# Patient Record
Sex: Female | Born: 1995 | Race: White | Hispanic: No | Marital: Married | Smoking: Never smoker
Health system: Southern US, Community
[De-identification: ages and names within clinical notes are randomized; demographics above are authoritative.]

## PROBLEM LIST (undated history)

## (undated) DIAGNOSIS — F419 Anxiety disorder, unspecified: Secondary | ICD-10-CM

## (undated) DIAGNOSIS — Z789 Other specified health status: Secondary | ICD-10-CM

## (undated) HISTORY — PX: NO PAST SURGERIES: SHX2092

## (undated) HISTORY — PX: WISDOM TOOTH EXTRACTION: SHX21

## (undated) HISTORY — DX: Anxiety disorder, unspecified: F41.9

---
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
## 2018-12-10 ENCOUNTER — Inpatient Hospital Stay (HOSPITAL_COMMUNITY)
Admission: AD | Admit: 2018-12-10 | Discharge: 2018-12-10 | Disposition: A | Discharge status: Home / Self Care | Payer: BC Managed Care – PPO | Attending: Obstetrics and Gynecology | Admitting: Obstetrics and Gynecology

## 2018-12-10 ENCOUNTER — Other Ambulatory Visit: Payer: Self-pay

## 2018-12-10 ENCOUNTER — Encounter (HOSPITAL_COMMUNITY): Payer: Self-pay | Admitting: *Deleted

## 2018-12-10 DIAGNOSIS — O210 Mild hyperemesis gravidarum: Secondary | ICD-10-CM | POA: Diagnosis not present

## 2018-12-10 DIAGNOSIS — Z3A08 8 weeks gestation of pregnancy: Secondary | ICD-10-CM | POA: Diagnosis not present

## 2018-12-10 HISTORY — DX: Other specified health status: Z78.9

## 2018-12-10 LAB — URINALYSIS, ROUTINE W REFLEX MICROSCOPIC
Bilirubin Urine: NEGATIVE
Glucose, UA: NEGATIVE mg/dL
Hgb urine dipstick: NEGATIVE
Ketones, ur: 15 mg/dL — AB
Nitrite: NEGATIVE
Protein, ur: NEGATIVE mg/dL
Specific Gravity, Urine: 1.025 (ref 1.005–1.030)
pH: 6 (ref 5.0–8.0)

## 2018-12-10 LAB — URINALYSIS, MICROSCOPIC (REFLEX)

## 2018-12-10 LAB — POCT PREGNANCY, URINE: Preg Test, Ur: POSITIVE — AB

## 2018-12-10 MED ORDER — ONDANSETRON 8 MG PO TBDP: 8.0000 mg | ORAL_TABLET | Freq: Three times a day (TID) | ORAL | 2 refills | Status: DC | PRN

## 2018-12-10 MED ORDER — ONDANSETRON 4 MG PO TBDP
8.0000 mg | ORAL_TABLET | Freq: Once | ORAL | Status: AC
  Administered 2018-12-10: 8 mg via ORAL
  Filled 2018-12-10: qty 2

## 2018-12-10 MED ORDER — DOCUSATE SODIUM 100 MG PO CAPS: 100.0000 mg | ORAL_CAPSULE | Freq: Three times a day (TID) | ORAL | 0 refills | Status: DC

## 2018-12-10 NOTE — MAU Provider Note (Addendum)
Patient Natalie Davenport is a 23 y.o. G1P0 At [redacted]w[redacted]d here with complaints of nausea and vomiting since this morning at 4 am. She found out she was pregnant around the 4th of July. Since July 4th, she has had some nausea but it got much worse last night.   She denies HA, constipation, diarrhea, dysuria or other complaint. She denies abdominal pain, vaginal discharge or other vaginal-pelvic complaint.  History     CSN: [id number]  Arrival date and time: 12/10/18 7673   First Provider Initiated Contact with Patient 12/10/18 [phone]      Chief Complaint  Patient presents with  . Emesis   Emesis  This is a new problem. The current episode started today. The problem occurs 5 to 10 times per day. The problem has been unchanged. The emesis has an appearance of stomach contents. There has been no fever. Pertinent negatives include no chills, coughing, diarrhea, dizziness, fever or headaches.  She was at work and had some soup at 2am, which she threw up. She then continued to vomit between 5-7 times and called Dr. Melba Coon. Dr. Melba Coon recommended she come to MAU for evaluation. Up until yestserday, she had been managing her nausea at home with eating light meals and resting. However, this morning her nausea and vomiting got much worse.   OB History    Gravida  1   Para      Term      Preterm      AB      Living        SAB      TAB      Ectopic      Multiple      Live Births              Past Medical History:  Diagnosis Date  . Medical history non-contributory     Past Surgical History:  Procedure Laterality Date  . NO PAST SURGERIES    . WISDOM TOOTH EXTRACTION      No family history on file.  Social History   Tobacco Use  . Smoking status: Not on file  Substance Use Topics  . Alcohol use: Not on file  . Drug use: Not on file    Allergies: No Known Allergies  Medications Prior to Admission  Medication Sig Dispense Refill Last Dose  . Prenatal Vit-Fe Fumarate-FA  (PRENATAL MULTIVITAMIN) TABS tablet Take 1 tablet by mouth daily at 12 noon.       Review of Systems  Constitutional: Negative for chills and fever.  Respiratory: Negative for cough.   Gastrointestinal: Positive for vomiting. Negative for diarrhea.  Genitourinary: Negative.   Musculoskeletal: Negative.   Neurological: Negative for dizziness and headaches.   Physical Exam   Blood pressure 119/66, pulse 82, temperature 98.2 F (36.8 C), temperature source Oral, resp. rate 16, weight 107.8 kg, SpO2 100 %.  Physical Exam  Constitutional: She appears well-developed.  HENT:  Head: Normocephalic.  Neck: Normal range of motion.  Respiratory: Effort normal.  GI: Soft.  Musculoskeletal: Normal range of motion.  Neurological: She is alert.  Skin: Skin is warm.    MAU Course  Procedures  MDM -UA shows only 15 of ketones; so IV and other labwork not performed. Patient received zofran in MAU, which she tolerated well. SHe then had ginger ale and crackers, which she kept down.  -Patient is very well-appearing and comfortable in bed, no distress.   Assessment and Plan   1. Morning sickness  2. RX sent for Zofran and colace, reviewed how to manage symptoms at home.   3. Patient to return to MAU if her condition were to change or worsen.   Charlesetta GaribaldiKathryn Lorraine Kooistra 12/10/2018, 8:47 AM

## 2018-12-10 NOTE — Discharge Instructions (Signed)
Morning Sickness  Morning sickness is when you feel sick to your stomach (nauseous) during pregnancy. You may feel sick to your stomach and throw up (vomit). You may feel sick in the morning, but you can feel this way at any time of day. Some women feel very sick to their stomach and cannot stop throwing up (hyperemesis gravidarum). Follow these instructions at home: Medicines  Take over-the-counter and prescription medicines only as told by your doctor. Do not take any medicines until you talk with your doctor about them first.  Taking multivitamins before getting pregnant can stop or lessen the harshness of morning sickness. Eating and drinking  Eat dry toast or crackers before getting out of bed.  Eat 5 or 6 small meals a day.  Eat dry and bland foods like rice and baked potatoes.  Do not eat greasy, fatty, or spicy foods.  Have someone cook for you if the smell of food causes you to feel sick or throw up.  If you feel sick to your stomach after taking prenatal vitamins, take them at night or with a snack.  Eat protein when you need a snack. Nuts, yogurt, and cheese are good choices.  Drink fluids throughout the day.  Try ginger ale made with real ginger, ginger tea made from fresh grated ginger, or ginger candies. General instructions  Do not use any products that have nicotine or tobacco in them, such as cigarettes and e-cigarettes. If you need help quitting, ask your doctor.  Use an air purifier to keep the air in your house free of smells.  Get lots of fresh air.  Try to avoid smells that make you feel sick.  Try: ? Wearing a bracelet that is used for seasickness (acupressure wristband). ? Going to a doctor who puts thin needles into certain body points (acupuncture) to improve how you feel. Contact a doctor if:  You need medicine to feel better.  You feel dizzy or light-headed.  You are losing weight. Get help right away if:  You feel very sick to your  stomach and cannot stop throwing up.  You pass out (faint).  You have very bad pain in your belly. Summary  Morning sickness is when you feel sick to your stomach (nauseous) during pregnancy.  You may feel sick in the morning, but you can feel this way at any time of day.  Making some changes to what you eat may help your symptoms go away. This information is not intended to replace advice given to you by your health care provider. Make sure you discuss any questions you have with your health care provider. Document Released: 06/12/2004 Document Revised: 04/17/2017 Document Reviewed: 06/05/2016 Elsevier Patient Education  2020 Else[employer]  

## 2018-12-10 NOTE — MAU Note (Signed)
Came to work not feeling well.  Started throwing up at 0400, continued like every 6min.  Called office, told to come in for fluids. Is about 8 wk preg.

## 2018-12-28 LAB — OB RESULTS CONSOLE GC/CHLAMYDIA
Chlamydia: NEGATIVE
Gonorrhea: NEGATIVE

## 2018-12-28 LAB — OB RESULTS CONSOLE RPR: RPR: NONREACTIVE

## 2018-12-28 LAB — OB RESULTS CONSOLE HEPATITIS B SURFACE ANTIGEN: Hepatitis B Surface Ag: NEGATIVE

## 2018-12-28 LAB — OB RESULTS CONSOLE RUBELLA ANTIBODY, IGM: Rubella: IMMUNE

## 2018-12-28 LAB — OB RESULTS CONSOLE ABO/RH: RH Type: POSITIVE

## 2018-12-28 LAB — OB RESULTS CONSOLE ANTIBODY SCREEN: Antibody Screen: NEGATIVE

## 2018-12-28 LAB — OB RESULTS CONSOLE HIV ANTIBODY (ROUTINE TESTING): HIV: NONREACTIVE

## 2019-01-04 DIAGNOSIS — F419 Anxiety disorder, unspecified: Secondary | ICD-10-CM | POA: Insufficient documentation

## 2019-04-26 ENCOUNTER — Inpatient Hospital Stay (HOSPITAL_COMMUNITY)
Admission: AD | Admit: 2019-04-26 | Discharge: 2019-04-26 | Disposition: A | Discharge status: Home / Self Care | Payer: BC Managed Care – PPO | Attending: Obstetrics and Gynecology | Admitting: Obstetrics and Gynecology

## 2019-04-26 ENCOUNTER — Encounter (HOSPITAL_COMMUNITY): Payer: Self-pay

## 2019-04-26 ENCOUNTER — Other Ambulatory Visit: Payer: Self-pay

## 2019-04-26 DIAGNOSIS — Z3A27 27 weeks gestation of pregnancy: Secondary | ICD-10-CM | POA: Insufficient documentation

## 2019-04-26 DIAGNOSIS — O4692 Antepartum hemorrhage, unspecified, second trimester: Secondary | ICD-10-CM | POA: Diagnosis present

## 2019-04-26 DIAGNOSIS — B373 Candidiasis of vulva and vagina: Secondary | ICD-10-CM | POA: Insufficient documentation

## 2019-04-26 DIAGNOSIS — Z79899 Other long term (current) drug therapy: Secondary | ICD-10-CM | POA: Insufficient documentation

## 2019-04-26 DIAGNOSIS — O98812 Other maternal infectious and parasitic diseases complicating pregnancy, second trimester: Secondary | ICD-10-CM | POA: Diagnosis not present

## 2019-04-26 DIAGNOSIS — O4693 Antepartum hemorrhage, unspecified, third trimester: Secondary | ICD-10-CM | POA: Diagnosis not present

## 2019-04-26 DIAGNOSIS — B3731 Acute candidiasis of vulva and vagina: Secondary | ICD-10-CM

## 2019-04-26 LAB — URINALYSIS, ROUTINE W REFLEX MICROSCOPIC
Bilirubin Urine: NEGATIVE
Glucose, UA: NEGATIVE mg/dL
Hgb urine dipstick: NEGATIVE
Ketones, ur: NEGATIVE mg/dL
Leukocytes,Ua: NEGATIVE
Nitrite: NEGATIVE
Protein, ur: NEGATIVE mg/dL
Specific Gravity, Urine: 1.003 — ABNORMAL LOW (ref 1.005–1.030)
pH: 7 (ref 5.0–8.0)

## 2019-04-26 LAB — WET PREP, GENITAL
Clue Cells Wet Prep HPF POC: NONE SEEN
Sperm: NONE SEEN
Trich, Wet Prep: NONE SEEN

## 2019-04-26 MED ORDER — TERCONAZOLE 0.4 % VA CREA: 1.0000 | TOPICAL_CREAM | Freq: Every day | VAGINAL | 0 refills | Status: AC

## 2019-04-26 NOTE — Discharge Instructions (Signed)
Vaginal Yeast infection, Adult  Vaginal yeast infection is a condition that causes vaginal discharge as well as soreness, swelling, and redness (inflammation) of the vagina. This is a common condition. Some women get this infection frequently. What are the causes? This condition is caused by a change in the normal balance of the yeast (candida) and bacteria that live in the vagina. This change causes an overgrowth of yeast, which causes the inflammation. What increases the risk? The condition is more likely to develop in women who:  Take antibiotic medicines.  Have diabetes.  Take birth control pills.  Are pregnant.  Douche often.  Have a weak body defense system (immune system).  Have been taking steroid medicines for a long time.  Frequently wear tight clothing. What are the signs or symptoms? Symptoms of this condition include:  White, thick, creamy vaginal discharge.  Swelling, itching, redness, and irritation of the vagina. The lips of the vagina (vulva) may be affected as well.  Pain or a burning feeling while urinating.  Pain during sex. How is this diagnosed? This condition is diagnosed based on:  Your medical history.  A physical exam.  A pelvic exam. Your health care provider will examine a sample of your vaginal discharge under a microscope. Your health care provider may send this sample for testing to confirm the diagnosis. How is this treated? This condition is treated with medicine. Medicines may be over-the-counter or prescription. You may be told to use one or more of the following:  Medicine that is taken by mouth (orally).  Medicine that is applied as a cream (topically).  Medicine that is inserted directly into the vagina (suppository). Follow these instructions at home:  Lifestyle  Do not have sex until your health care provider approves. Tell your sex partner that you have a yeast infection. That person should go to his or her health care  provider and ask if they should also be treated.  Do not wear tight clothes, such as pantyhose or tight pants.  Wear breathable cotton underwear. General instructions  Take or apply over-the-counter and prescription medicines only as told by your health care provider.  Eat more yogurt. This may help to keep your yeast infection from returning.  Do not use tampons until your health care provider approves.  Try taking a sitz bath to help with discomfort. This is a warm water bath that is taken while you are sitting down. The water should only come up to your hips and should cover your buttocks. Do this 3-4 times per day or as told by your health care provider.  Do not douche.  If you have diabetes, keep your blood sugar levels under control.  Keep all follow-up visits as told by your health care provider. This is important. Contact a health care provider if:  You have a fever.  Your symptoms go away and then return.  Your symptoms do not get better with treatment.  Your symptoms get worse.  You have new symptoms.  You develop blisters in or around your vagina.  You have blood coming from your vagina and it is not your menstrual period.  You develop pain in your abdomen. Summary  Vaginal yeast infection is a condition that causes discharge as well as soreness, swelling, and redness (inflammation) of the vagina.  This condition is treated with medicine. Medicines may be over-the-counter or prescription.  Take or apply over-the-counter and prescription medicines only as told by your health care provider.  Do not douche.   Do not have sex or use tampons until your health care provider approves.  Contact a health care provider if your symptoms do not get better with treatment or your symptoms go away and then return. This information is not intended to replace advice given to you by your health care provider. Make sure you discuss any questions you have with your health care  provider. Document Released: 02/12/2005 Document Revised: 09/21/2017 Document Reviewed: 09/21/2017 Elsevier Patient Education  2020 Elsevier Inc.  

## 2019-04-26 NOTE — MAU Provider Note (Signed)
History     CSN: [id number]  Arrival date and time: 04/26/19 1541   First Provider Initiated Contact with Patient 04/26/19 1625      Chief Complaint  Patient presents with  . Vaginal Bleeding   Grier Czerwinski is a 23 y.o. G1P0 at [redacted]w[redacted]d who presents today with vaginal bleeding that started today around 1430. She denies any pain. She reports normal fetal movement. She denies any complications with this pregnancy. She denies placenta previa.   Vaginal Bleeding The patient's primary symptoms include vaginal bleeding. The patient's pertinent negatives include no pelvic pain or vaginal discharge. This is a new problem. The current episode started today. Episode frequency: 2 x today  The problem has been resolved. The patient is experiencing no pain. She is pregnant. Pertinent negatives include no chills, dysuria, fever, frequency, nausea or vomiting. The vaginal discharge was bloody. The vaginal bleeding is lighter than menses. She has not been passing clots. She has not been passing tissue. The symptoms are aggravated by activity (patient reports that she had been shopping for a few hours prior to the bleeding starting. ). Sexual activity: denies intercourse in the last 24 hours.    OB History    Gravida  1   Para      Term      Preterm      AB      Living        SAB      TAB      Ectopic      Multiple      Live Births              Past Medical History:  Diagnosis Date  . Medical history non-contributory     Past Surgical History:  Procedure Laterality Date  . NO PAST SURGERIES    . WISDOM TOOTH EXTRACTION      History reviewed. No pertinent family history.  Social History   Tobacco Use  . Smoking status: Never Smoker  . Smokeless tobacco: Never Used  Substance Use Topics  . Alcohol use: Not Currently    Frequency: Never  . Drug use: Never    Allergies:  Allergies  Allergen Reactions  . Septra [Sulfamethoxazole-Trimethoprim] Rash    Medications  Prior to Admission  Medication Sig Dispense Refill Last Dose  . docusate sodium (COLACE) 100 MG capsule Take 1 capsule (100 mg total) by mouth 3 (three) times daily. 90 capsule 0 04/25/2019 at Unknown time  . famotidine (PEPCID) 20 MG tablet Take 20 mg by mouth 2 (two) times daily.   04/26/2019 at Unknown time  . ondansetron (ZOFRAN-ODT) 8 MG disintegrating tablet Take 1 tablet (8 mg total) by mouth every 8 (eight) hours as needed for nausea or vomiting. 60 tablet 2 Past Month at Unknown time  . Prenatal Vit-Fe Fumarate-FA (PRENATAL MULTIVITAMIN) TABS tablet Take 1 tablet by mouth daily at 12 noon.   04/25/2019 at Unknown time    Review of Systems  Constitutional: Negative for chills and fever.  Gastrointestinal: Negative for nausea and vomiting.  Genitourinary: Positive for vaginal bleeding. Negative for dysuria, frequency, pelvic pain and vaginal discharge.   Physical Exam   Blood pressure 126/77, pulse (!) 119, temperature 98.3 F (36.8 C), resp. rate 20, weight 116.3 kg, SpO2 95 %.  Physical Exam  Nursing note and vitals reviewed. Constitutional: She is oriented to person, place, and time. She appears well-developed and well-nourished. No distress.  HENT:  Head: Normocephalic.  Cardiovascular: Normal rate.  Respiratory: Effort normal.  GI: Soft. There is no abdominal tenderness. There is no rebound.  Genitourinary:    Genitourinary Comments: External: no lesion Vagina: moderate amount of thick, white, adherent discharge. No blood seen  Cervix: pink, smooth, no CMT, closed/thick  Uterus: AGA   Neurological: She is alert and oriented to person, place, and time.  Skin: Skin is warm and dry.  Psychiatric: She has a normal mood and affect.   NST:  Baseline: 145 Variability: moderate Accels: 15x15 Decels: none Toco: none   Results for orders placed or performed during the hospital encounter of 04/26/19 (from the past 24 hour(s))  Wet prep, genital     Status: Abnormal    Collection Time: 04/26/19  4:42 PM  Result Value Ref Range   Yeast Wet Prep HPF POC PRESENT (A) NONE SEEN   Trich, Wet Prep NONE SEEN NONE SEEN   Clue Cells Wet Prep HPF POC NONE SEEN NONE SEEN   WBC, Wet Prep HPF POC MANY (A) NONE SEEN   Sperm NONE SEEN   Urinalysis, Routine w reflex microscopic     Status: Abnormal   Collection Time: 04/26/19  4:48 PM  Result Value Ref Range   Color, Urine COLORLESS (A) YELLOW   APPearance CLEAR CLEAR   Specific Gravity, Urine 1.003 (L) 1.005 - 1.030   pH 7.0 5.0 - 8.0   Glucose, UA NEGATIVE NEGATIVE mg/dL   Hgb urine dipstick NEGATIVE NEGATIVE   Bilirubin Urine NEGATIVE NEGATIVE   Ketones, ur NEGATIVE NEGATIVE mg/dL   Protein, ur NEGATIVE NEGATIVE mg/dL   Nitrite NEGATIVE NEGATIVE   Leukocytes,Ua NEGATIVE NEGATIVE    MAU Course  Procedures  MDM   Assessment and Plan   1. Yeast infection involving the vagina and surrounding area   2. Vaginal bleeding in pregnancy, third trimester   3. [redacted] weeks gestation of pregnancy    DC home 3rd Trimester precautions  Bleeding precautions PTL precautions  Fetal kick counts RX: terzol 7 as directed  Return to MAU as needed FU with OB as planned  Follow-up Information    Bovard-Stuckert, Jody, MD Follow up.   Specialty: Obstetrics and Gynecology Contact information: [address] [address] [place] DNP, CNM  04/26/19  5:20 PM

## 2019-04-26 NOTE — MAU Note (Signed)
Pt states that she went to use the restroom to pee at 1440 and saw some bright red bleeding when wiping. Pt reports calling her doctor office and leaving a message. Pt waited 20 minutes and went to pee again and saw more bright red bleeding when wiping. Pt states she heard back from the office and was told to come into the office, but then received another phone call to be seen in MAU instead of the office.  Denies pain.

## 2019-04-27 LAB — GC/CHLAMYDIA PROBE AMP (~~LOC~~) NOT AT ARMC
Chlamydia: NEGATIVE
Comment: NEGATIVE
Comment: NORMAL
Neisseria Gonorrhea: NEGATIVE

## 2019-05-20 NOTE — L&D Delivery Note (Signed)
Delivery Note Pt pushed very well for 20 minutes for delivery.  At 10:11 PM a viable and healthy female was delivered via Vaginal, Spontaneous (Presentation:   Occiput Anterior).  APGAR: 7, 9; weight P.   Placenta status: Spontaneous, Intact.  Cord: 3 vessels with the following complications: Nuchal x 1.    Anesthesia: Epidural Episiotomy:  none Lacerations: 2nd degree;B Labial Suture Repair: 3.0 vicryl rapide Est. Blood Loss (mL): 143cc  Mom to postpartum.  Baby to Couplet care / Skin to Skin.  Natalie Davenport 07/29/2019, 10:44 PM  O+/Tdap in PNC/RI/Br/ Contra?

## 2019-07-05 LAB — OB RESULTS CONSOLE GBS: GBS: POSITIVE

## 2019-07-22 ENCOUNTER — Telehealth (HOSPITAL_COMMUNITY): Payer: Self-pay | Admitting: *Deleted

## 2019-07-22 ENCOUNTER — Encounter (HOSPITAL_COMMUNITY): Payer: Self-pay | Admitting: *Deleted

## 2019-07-22 NOTE — Telephone Encounter (Signed)
Preadmission screen  

## 2019-07-27 ENCOUNTER — Other Ambulatory Visit (HOSPITAL_COMMUNITY)
Admission: RE | Admit: 2019-07-27 | Discharge: 2019-07-27 | Disposition: A | Discharge status: Home / Self Care | Payer: BC Managed Care – PPO | Source: Ambulatory Visit | Attending: Obstetrics and Gynecology | Admitting: Obstetrics and Gynecology

## 2019-07-27 DIAGNOSIS — Z20822 Contact with and (suspected) exposure to covid-19: Secondary | ICD-10-CM | POA: Insufficient documentation

## 2019-07-27 DIAGNOSIS — Z01812 Encounter for preprocedural laboratory examination: Secondary | ICD-10-CM | POA: Insufficient documentation

## 2019-07-27 LAB — SARS CORONAVIRUS 2 (TAT 6-24 HRS): SARS Coronavirus 2: NEGATIVE

## 2019-07-28 ENCOUNTER — Other Ambulatory Visit: Payer: Self-pay | Admitting: Obstetrics and Gynecology

## 2019-07-28 ENCOUNTER — Encounter: Payer: Self-pay | Admitting: Obstetrics and Gynecology

## 2019-07-28 DIAGNOSIS — Z3403 Encounter for supervision of normal first pregnancy, third trimester: Secondary | ICD-10-CM | POA: Insufficient documentation

## 2019-07-28 HISTORY — DX: Encounter for supervision of normal first pregnancy, third trimester: Z34.03

## 2019-07-28 NOTE — H&P (Signed)
Natalie Davenport is a 24 y.o. female G1P0 at 39+ for IOL given term.  +FM, no LOF, no VB, occ ctx.  Pregnancy complicated by GBBS + UTI in early pregnancy, maternal obesity and h/o anxiety.  Some N/V of early pregnancy.  D/W pt r/b/a of IOL as well as process.  Pregnancy dated by 1st trimester [place].     OB History    Gravida  1   Para      Term      Preterm      AB      Living        SAB      TAB      Ectopic      Multiple      Live Births            G1 present  No abn pap No STD  Past Medical History:  Diagnosis Date  . Anxiety   . Encounter for supervision of normal first pregnancy in third trimester 07/28/2019  . Medical history non-contributory    Past Surgical History:  Procedure Laterality Date  . NO PAST SURGERIES    . WISDOM TOOTH EXTRACTION     Family History: family history includes Breast cancer in her paternal grandmother; Hyperlipidemia in her maternal aunt, maternal grandfather, and maternal grandmother. Social History:  reports that she has never smoked. She has never used smokeless tobacco. She reports previous alcohol use. She reports that she does not use drugs. 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   HENT: Negative.   Respiratory: Negative.   Cardiovascular: Negative.   Gastrointestinal: Negative.   Genitourinary: Negative.   Musculoskeletal: Positive for back pain.  Skin: Negative.   Allergic/Immunologic: Negative.   Neurological: Negative.   Hematological: Negative.   Psychiatric/Behavioral: Negative.    Maternal Medical History:  Contractions: Frequency: irregular.    Fetal  activity: Perceived fetal activity is normal.    Prenatal complications: Had nausea and vomiting of early pregnancy  Prenatal Complications - Diabetes: none.      There were no vitals taken for this visit. Maternal Exam:  Uterine Assessment: Contraction frequency is irregular.   Abdomen: Patient reports no abdominal tenderness. Fundal height is appropriate for gestation.   Estimated fetal weight is 7-8#.   Fetal presentation: vertex  Introitus: Normal vulva. Normal vagina.    Physical Exam  Constitutional: She is oriented to person, place, and time. She appears well-developed and well-nourished.  HENT:  Head: Normocephalic and atraumatic.  Cardiovascular: Normal rate and regular rhythm.  Respiratory: Effort normal and breath sounds normal. No respiratory distress. She has no wheezes.  GI: Soft. Bowel sounds are normal. She exhibits no distension. There is no abdominal tenderness.  Genitourinary:    Vulva normal.   Musculoskeletal:        General: Normal range of motion.  Neurological: She is alert and oriented to person, place, and time.  Skin: Skin is warm and dry.  Psychiatric: She has a normal mood and affect. Her behavior is normal.    Prenatal labs: ABO, Rh: O/Positive/-- (08/11 0000) Antibody: Negative (08/11 0000) Rubella: Immune (08/11 0000) RPR: Nonreactive (08/11 0000)  HBsAg: Negative (08/11 0000)  HIV: Non-reactive (08/11 0000)  GBS:   positive  Tdap 12/16, Flu thru work Hgb 14.5/Plt 405/Ur Cx GBBS+/GC neg/Chl neg/Varicella immune/Hgb electro WNL/AFP WNL/glucola 87/Hep C neg/essential panel neg  1st tri [place] dates preg [place] limited anat, post plac, (female) F/u complete anat  Assessment/Plan: 23yo G1P0 at 39+ for IOL Admit at [facility] - cytotec po and vag In AM AROM/pitocin or poss foley bulb Epidural or nitrous prn Expect SVD   Natalie Davenport 07/28/2019, 8:04 PM

## 2019-07-28 NOTE — H&P (Deleted)
  The note originally documented on this encounter has been moved the the encounter in which it belongs.  

## 2019-07-29 ENCOUNTER — Inpatient Hospital Stay (HOSPITAL_COMMUNITY): Payer: BC Managed Care – PPO | Admitting: Anesthesiology

## 2019-07-29 ENCOUNTER — Encounter (HOSPITAL_COMMUNITY): Payer: Self-pay | Admitting: Obstetrics and Gynecology

## 2019-07-29 ENCOUNTER — Inpatient Hospital Stay (HOSPITAL_COMMUNITY): Payer: BC Managed Care – PPO

## 2019-07-29 ENCOUNTER — Other Ambulatory Visit: Payer: Self-pay

## 2019-07-29 ENCOUNTER — Inpatient Hospital Stay (HOSPITAL_COMMUNITY)
Admission: RE | Admit: 2019-07-29 | Discharge: 2019-07-31 | DRG: 807 | Disposition: A | Discharge status: Home / Self Care | Payer: BC Managed Care – PPO | Attending: Obstetrics and Gynecology | Admitting: Obstetrics and Gynecology

## 2019-07-29 DIAGNOSIS — O26893 Other specified pregnancy related conditions, third trimester: Secondary | ICD-10-CM | POA: Diagnosis present

## 2019-07-29 DIAGNOSIS — E669 Obesity, unspecified: Secondary | ICD-10-CM | POA: Diagnosis present

## 2019-07-29 DIAGNOSIS — Z3A39 39 weeks gestation of pregnancy: Secondary | ICD-10-CM

## 2019-07-29 DIAGNOSIS — O99824 Streptococcus B carrier state complicating childbirth: Secondary | ICD-10-CM | POA: Diagnosis present

## 2019-07-29 DIAGNOSIS — O99214 Obesity complicating childbirth: Secondary | ICD-10-CM | POA: Diagnosis present

## 2019-07-29 DIAGNOSIS — Z3493 Encounter for supervision of normal pregnancy, unspecified, third trimester: Secondary | ICD-10-CM

## 2019-07-29 LAB — TYPE AND SCREEN
ABO/RH(D): O POS
Antibody Screen: NEGATIVE

## 2019-07-29 LAB — COMPREHENSIVE METABOLIC PANEL
ALT: 25 U/L (ref 0–44)
AST: 22 U/L (ref 15–41)
Albumin: 2.7 g/dL — ABNORMAL LOW (ref 3.5–5.0)
Alkaline Phosphatase: 171 U/L — ABNORMAL HIGH (ref 38–126)
Anion gap: 11 (ref 5–15)
BUN: 7 mg/dL (ref 6–20)
CO2: 20 mmol/L — ABNORMAL LOW (ref 22–32)
Calcium: 9 mg/dL (ref 8.9–10.3)
Chloride: 105 mmol/L (ref 98–111)
Creatinine, Ser: 0.82 mg/dL (ref 0.44–1.00)
GFR calc Af Amer: >60 mL/min (ref >60)
GFR calc non Af Amer: >60 mL/min (ref >60)
Glucose, Bld: 102 mg/dL — ABNORMAL HIGH (ref 70–99)
Potassium: 4.5 mmol/L (ref 3.5–5.1)
Sodium: 136 mmol/L (ref 135–145)
Total Bilirubin: 0.8 mg/dL (ref 0.3–1.2)
Total Protein: 5.8 g/dL — ABNORMAL LOW (ref 6.5–8.1)

## 2019-07-29 LAB — PROTEIN / CREATININE RATIO, URINE
Creatinine, Urine: 94.71 mg/dL
Protein Creatinine Ratio: 0.11 mg/mg{Cre} (ref 0.00–0.15)
Total Protein, Urine: 10 mg/dL

## 2019-07-29 LAB — CBC
HCT: 38.9 % (ref 36.0–46.0)
Hemoglobin: 13.4 g/dL (ref 12.0–15.0)
MCH: 30 pg (ref 26.0–34.0)
MCHC: 34.4 g/dL (ref 30.0–36.0)
MCV: 87.2 fL (ref 80.0–100.0)
Platelets: 337 10*3/uL (ref 150–400)
RBC: 4.46 MIL/uL (ref 3.87–5.11)
RDW: 12.4 % (ref 11.5–15.5)
WBC: 13.8 10*3/uL — ABNORMAL HIGH (ref 4.0–10.5)
nRBC: 0 % (ref 0.0–0.2)

## 2019-07-29 LAB — ABO/RH: ABO/RH(D): O POS

## 2019-07-29 LAB — RPR: RPR Ser Ql: NONREACTIVE

## 2019-07-29 MED ORDER — SODIUM CHLORIDE 0.9 % IV SOLN
5.0000 10*6.[IU] | Freq: Once | INTRAVENOUS | Status: AC
  Administered 2019-07-29: 5 10*6.[IU] via INTRAVENOUS
  Filled 2019-07-29: qty 5

## 2019-07-29 MED ORDER — BUTORPHANOL TARTRATE 1 MG/ML IJ SOLN
1.0000 mg | INTRAMUSCULAR | Status: DC | PRN
  Administered 2019-07-29 (×2): 1 mg via INTRAVENOUS
  Filled 2019-07-29 (×2): qty 1

## 2019-07-29 MED ORDER — SODIUM CHLORIDE (PF) 0.9 % IJ SOLN
INTRAMUSCULAR | Status: DC | PRN
  Administered 2019-07-29: 12 mL/h via EPIDURAL

## 2019-07-29 MED ORDER — PHENYLEPHRINE 40 MCG/ML (10ML) SYRINGE FOR IV PUSH (FOR BLOOD PRESSURE SUPPORT)
80.0000 ug | PREFILLED_SYRINGE | INTRAVENOUS | Status: DC | PRN
  Filled 2019-07-29: qty 10

## 2019-07-29 MED ORDER — OXYTOCIN 40 UNITS IN NORMAL SALINE INFUSION - SIMPLE MED
1.0000 m[IU]/min | INTRAVENOUS | Status: DC
  Administered 2019-07-29: 15:00:00 2 m[IU]/min via INTRAVENOUS
  Filled 2019-07-29: qty 1000

## 2019-07-29 MED ORDER — OXYTOCIN BOLUS FROM INFUSION
500.0000 mL | Freq: Once | INTRAVENOUS | Status: AC
  Administered 2019-07-29: 22:00:00 500 mL via INTRAVENOUS

## 2019-07-29 MED ORDER — LACTATED RINGERS IV SOLN: INTRAVENOUS | Status: DC

## 2019-07-29 MED ORDER — PHENYLEPHRINE 40 MCG/ML (10ML) SYRINGE FOR IV PUSH (FOR BLOOD PRESSURE SUPPORT): 80.0000 ug | PREFILLED_SYRINGE | INTRAVENOUS | Status: DC | PRN

## 2019-07-29 MED ORDER — SODIUM CHLORIDE 0.9 % IV SOLN
2.0000 g | Freq: Four times a day (QID) | INTRAVENOUS | Status: DC
  Administered 2019-07-29: 2 g via INTRAVENOUS
  Filled 2019-07-29 (×4): qty 2000

## 2019-07-29 MED ORDER — PENICILLIN G POT IN DEXTROSE 60000 UNIT/ML IV SOLN
3.0000 10*6.[IU] | INTRAVENOUS | Status: DC
  Administered 2019-07-29 (×4): 3 10*6.[IU] via INTRAVENOUS
  Filled 2019-07-29 (×4): qty 50

## 2019-07-29 MED ORDER — ACETAMINOPHEN 325 MG PO TABS: 650.0000 mg | ORAL_TABLET | ORAL | Status: DC | PRN

## 2019-07-29 MED ORDER — OXYCODONE-ACETAMINOPHEN 5-325 MG PO TABS: 2.0000 | ORAL_TABLET | ORAL | Status: DC | PRN

## 2019-07-29 MED ORDER — MISOPROSTOL 50MCG HALF TABLET
50.0000 ug | ORAL_TABLET | Freq: Three times a day (TID) | ORAL | Status: DC | PRN
  Administered 2019-07-29 (×2): 50 ug via ORAL
  Filled 2019-07-29 (×2): qty 1

## 2019-07-29 MED ORDER — OXYTOCIN 40 UNITS IN NORMAL SALINE INFUSION - SIMPLE MED: 2.5000 [IU]/h | INTRAVENOUS | Status: DC

## 2019-07-29 MED ORDER — TERBUTALINE SULFATE 1 MG/ML IJ SOLN: 0.2500 mg | Freq: Once | INTRAMUSCULAR | Status: DC | PRN

## 2019-07-29 MED ORDER — SOD CITRATE-CITRIC ACID 500-334 MG/5ML PO SOLN: 30.0000 mL | ORAL | Status: DC | PRN

## 2019-07-29 MED ORDER — GENTAMICIN SULFATE 40 MG/ML IJ SOLN: 5.0000 mg/kg | INTRAVENOUS | Status: DC

## 2019-07-29 MED ORDER — ONDANSETRON HCL 4 MG/2ML IJ SOLN
4.0000 mg | Freq: Four times a day (QID) | INTRAMUSCULAR | Status: DC | PRN
  Administered 2019-07-29: 15:00:00 4 mg via INTRAVENOUS
  Filled 2019-07-29: qty 2

## 2019-07-29 MED ORDER — GENTAMICIN SULFATE 40 MG/ML IJ SOLN
5.0000 mg/kg | INTRAVENOUS | Status: AC
  Administered 2019-07-29: 430 mg via INTRAVENOUS
  Filled 2019-07-29: qty 10.75

## 2019-07-29 MED ORDER — LIDOCAINE HCL (PF) 1 % IJ SOLN
30.0000 mL | INTRAMUSCULAR | Status: AC | PRN
  Administered 2019-07-29 (×2): 5 mL via SUBCUTANEOUS

## 2019-07-29 MED ORDER — ACETAMINOPHEN 500 MG PO TABS
1000.0000 mg | ORAL_TABLET | Freq: Once | ORAL | Status: AC
  Administered 2019-07-29: 20:00:00 1000 mg via ORAL
  Filled 2019-07-29: qty 2

## 2019-07-29 MED ORDER — DIPHENHYDRAMINE HCL 50 MG/ML IJ SOLN: 12.5000 mg | INTRAMUSCULAR | Status: DC | PRN

## 2019-07-29 MED ORDER — LACTATED RINGERS IV SOLN: 500.0000 mL | INTRAVENOUS | Status: DC | PRN

## 2019-07-29 MED ORDER — EPHEDRINE 5 MG/ML INJ: 10.0000 mg | INTRAVENOUS | Status: DC | PRN

## 2019-07-29 MED ORDER — FENTANYL-BUPIVACAINE-NACL 0.5-0.125-0.9 MG/250ML-% EP SOLN
12.0000 mL/h | EPIDURAL | Status: DC | PRN
  Filled 2019-07-29: qty 250

## 2019-07-29 MED ORDER — LACTATED RINGERS IV SOLN: 500.0000 mL | Freq: Once | INTRAVENOUS | Status: DC

## 2019-07-29 MED ORDER — OXYCODONE-ACETAMINOPHEN 5-325 MG PO TABS: 1.0000 | ORAL_TABLET | ORAL | Status: DC | PRN

## 2019-07-29 MED ORDER — MISOPROSTOL 25 MCG QUARTER TABLET
25.0000 ug | ORAL_TABLET | Freq: Three times a day (TID) | ORAL | Status: DC | PRN
  Administered 2019-07-29: 05:00:00 25 ug via VAGINAL
  Filled 2019-07-29: qty 1

## 2019-07-29 NOTE — Progress Notes (Signed)
Patient ID: Phelicia Dantes, female   DOB: Aug 14, 1995, 24 y.o.   MRN: [id number]   Comfortable w epidural  AFVSS gen NAD FHTs 150's mod var, + accels, category 1 toco q 2-4min  SVE 6/90/0-+1  Continue IOL, expect SVD

## 2019-07-29 NOTE — Progress Notes (Signed)
Patient ID: Natalie Davenport, female   DOB: 08-17-1995, 24 y.o.   MRN: [id number]   Term IOL  Some ctx O/N overall comfortable.  Reviewed History and POC, no changes.  AFVSS gen NAD FHTs 140's, mod var, category 1, + accels toco irr  SVE FT/th/high per RN  Foley bulb placed w/o diff/complication  Just received cytotec #3  Continue IOL, expect SVD

## 2019-07-29 NOTE — Anesthesia Preprocedure Evaluation (Signed)
Anesthesia Evaluation  Patient identified by MRN, date of birth, ID band Patient awake    Reviewed: Allergy & Precautions, H&P , NPO status , Patient's Chart, lab work & pertinent test results  History of Anesthesia Complications Negative for: history of anesthetic complications  Airway Mallampati: II  TM Distance: >3 FB Neck ROM: full    Dental no notable dental hx. (+) Teeth Intact   Pulmonary neg pulmonary ROS,    Pulmonary exam normal breath sounds clear to auscultation       Cardiovascular negative cardio ROS Normal cardiovascular exam Rhythm:regular Rate:Normal     Neuro/Psych negative neurological ROS  negative psych ROS   GI/Hepatic negative GI ROS, Neg liver ROS,   Endo/Other  Morbid obesity  Renal/GU negative Renal ROS  negative genitourinary   Musculoskeletal   Abdominal (+) + obese,   Peds  Hematology negative hematology ROS (+)   Anesthesia Other Findings   Reproductive/Obstetrics (+) Pregnancy                             Anesthesia Physical Anesthesia Plan  ASA: III  Anesthesia Plan: Epidural   Post-op Pain Management:    Induction:   PONV Risk Score and Plan:   Airway Management Planned:   Additional Equipment:   Intra-op Plan:   Post-operative Plan:   Informed Consent: I have reviewed the patients History and Physical, chart, labs and discussed the procedure including the risks, benefits and alternatives for the proposed anesthesia with the patient or authorized representative who has indicated his/her understanding and acceptance.       Plan Discussed with:   Anesthesia Plan Comments:         Anesthesia Quick Evaluation

## 2019-07-29 NOTE — Progress Notes (Signed)
Patient ID: Michalle Rademaker, female   DOB: [birth year], 24 y.o.   MRN: [id number]   Comfortable w epidural.  Foley bulb came out.  Pt endorses back labor. Will change positions.  Will check labs w elevated BP.  AFVSS, some elevated BP gen NAD FHTs 130's mod var, +accel, category 1 toco Q  5/70/-1  AROM for clear fluid, w/o diff/comp  IUPC placed

## 2019-07-29 NOTE — Anesthesia Procedure Notes (Signed)
Epidural Patient location during procedure: OB Start time: 07/29/2019 12:41 PM End time: 07/29/2019 12:51 PM  Staffing Anesthesiologist: Leonides Grills, MD Performed: anesthesiologist   Preanesthetic Checklist Completed: patient identified, IV checked, site marked, risks and benefits discussed, monitors and equipment checked, pre-op evaluation and timeout performed  Epidural Patient position: sitting Prep: DuraPrep Patient monitoring: heart rate, cardiac monitor, continuous pulse ox and blood pressure Approach: midline Location: L4-L5 Injection technique: LOR air  Needle:  Needle type: Tuohy  Needle gauge: 17 G Needle length: 9 cm Needle insertion depth: 7 cm Catheter type: closed end flexible Catheter size: 19 Gauge Catheter at skin depth: 13 cm Test dose: negative and Other  Assessment Events: blood not aspirated, injection not painful, no injection resistance and negative IV test  Additional Notes Informed consent obtained prior to proceeding including risk of failure, 1% risk of PDPH, risk of minor discomfort and bruising. Discussed alternatives to epidural analgesia and patient desires to proceed.  Timeout performed pre-procedure verifying patient name, procedure, and platelet count.  Patient tolerated procedure well. Reason for block:procedure for pain

## 2019-07-30 ENCOUNTER — Encounter (HOSPITAL_COMMUNITY): Payer: Self-pay | Admitting: Obstetrics and Gynecology

## 2019-07-30 LAB — CBC
HCT: 34 % — ABNORMAL LOW (ref 36.0–46.0)
Hemoglobin: 11.5 g/dL — ABNORMAL LOW (ref 12.0–15.0)
MCH: 30.3 pg (ref 26.0–34.0)
MCHC: 33.8 g/dL (ref 30.0–36.0)
MCV: 89.7 fL (ref 80.0–100.0)
Platelets: 242 10*3/uL (ref 150–400)
RBC: 3.79 MIL/uL — ABNORMAL LOW (ref 3.87–5.11)
RDW: 12.6 % (ref 11.5–15.5)
WBC: 17.9 10*3/uL — ABNORMAL HIGH (ref 4.0–10.5)
nRBC: 0 % (ref 0.0–0.2)

## 2019-07-30 MED ORDER — SIMETHICONE 80 MG PO CHEW: 80.0000 mg | CHEWABLE_TABLET | ORAL | Status: DC | PRN

## 2019-07-30 MED ORDER — ONDANSETRON HCL 4 MG/2ML IJ SOLN: 4.0000 mg | INTRAMUSCULAR | Status: DC | PRN

## 2019-07-30 MED ORDER — COCONUT OIL OIL
1.0000 "application " | TOPICAL_OIL | Status: DC | PRN
  Administered 2019-07-30: 1 via TOPICAL

## 2019-07-30 MED ORDER — OXYCODONE HCL 5 MG PO TABS: 10.0000 mg | ORAL_TABLET | ORAL | Status: DC | PRN

## 2019-07-30 MED ORDER — FAMOTIDINE 20 MG PO TABS
20.0000 mg | ORAL_TABLET | Freq: Two times a day (BID) | ORAL | Status: DC
  Administered 2019-07-30 – 2019-07-31 (×3): 20 mg via ORAL
  Filled 2019-07-30 (×3): qty 1

## 2019-07-30 MED ORDER — LACTATED RINGERS IV SOLN: INTRAVENOUS | Status: DC

## 2019-07-30 MED ORDER — PRENATAL MULTIVITAMIN CH
1.0000 | ORAL_TABLET | Freq: Every day | ORAL | Status: DC
  Administered 2019-07-30 – 2019-07-31 (×2): 1 via ORAL
  Filled 2019-07-30 (×2): qty 1

## 2019-07-30 MED ORDER — SENNOSIDES-DOCUSATE SODIUM 8.6-50 MG PO TABS: 2.0000 | ORAL_TABLET | ORAL | Status: DC

## 2019-07-30 MED ORDER — ZOLPIDEM TARTRATE 5 MG PO TABS: 5.0000 mg | ORAL_TABLET | Freq: Every evening | ORAL | Status: DC | PRN

## 2019-07-30 MED ORDER — BENZOCAINE-MENTHOL 20-0.5 % EX AERO
1.0000 "application " | INHALATION_SPRAY | CUTANEOUS | Status: DC | PRN
  Administered 2019-07-30: 1 via TOPICAL
  Filled 2019-07-30: qty 56

## 2019-07-30 MED ORDER — DIPHENHYDRAMINE HCL 25 MG PO CAPS: 25.0000 mg | ORAL_CAPSULE | Freq: Four times a day (QID) | ORAL | Status: DC | PRN

## 2019-07-30 MED ORDER — IBUPROFEN 600 MG PO TABS
600.0000 mg | ORAL_TABLET | Freq: Four times a day (QID) | ORAL | Status: DC
  Administered 2019-07-30 – 2019-07-31 (×7): 600 mg via ORAL
  Filled 2019-07-30 (×7): qty 1

## 2019-07-30 MED ORDER — OXYCODONE HCL 5 MG PO TABS: 5.0000 mg | ORAL_TABLET | ORAL | Status: DC | PRN

## 2019-07-30 MED ORDER — CALCIUM CARBONATE ANTACID 500 MG PO CHEW: 2.0000 | CHEWABLE_TABLET | Freq: Three times a day (TID) | ORAL | Status: DC | PRN

## 2019-07-30 MED ORDER — DIBUCAINE (PERIANAL) 1 % EX OINT: 1.0000 "application " | TOPICAL_OINTMENT | CUTANEOUS | Status: DC | PRN

## 2019-07-30 MED ORDER — ONDANSETRON HCL 4 MG PO TABS: 4.0000 mg | ORAL_TABLET | ORAL | Status: DC | PRN

## 2019-07-30 MED ORDER — ACETAMINOPHEN 325 MG PO TABS: 650.0000 mg | ORAL_TABLET | ORAL | Status: DC | PRN

## 2019-07-30 MED ORDER — DOCUSATE SODIUM 100 MG PO CAPS
100.0000 mg | ORAL_CAPSULE | Freq: Every day | ORAL | Status: DC
  Administered 2019-07-30 – 2019-07-31 (×2): 100 mg via ORAL
  Filled 2019-07-30 (×2): qty 1

## 2019-07-30 MED ORDER — WITCH HAZEL-GLYCERIN EX PADS: 1.0000 "application " | MEDICATED_PAD | CUTANEOUS | Status: DC | PRN

## 2019-07-30 MED ORDER — TETANUS-DIPHTH-ACELL PERTUSSIS 5-2.5-18.5 LF-MCG/0.5 IM SUSP: 0.5000 mL | Freq: Once | INTRAMUSCULAR | Status: DC

## 2019-07-30 NOTE — Progress Notes (Signed)
Post Partum Day 2 Subjective: no complaints, up ad lib, voiding, tolerating PO and nl lochia, pain controlled  Objective: Blood pressure 123/75, pulse 75, temperature 98 F (36.7 C), temperature source Oral, resp. rate 18, height 5\' 7"  (1.702 m), weight 122.9 kg, SpO2 98 %, unknown if currently breastfeeding.  Physical Exam:  General: alert and no distress Lochia: appropriate Uterine Fundus: firm   Recent Labs    07/29/19 0020 07/30/19 0446  HGB 13.4 11.5*  HCT 38.9 34.0*    Assessment/Plan: Plan for discharge tomorrow, Breastfeeding and Lactation consult.  Routine PP care.     LOS: 1 day   Fonda Rochon Bovard-Stuckert 07/30/2019, 6:14 AM

## 2019-07-30 NOTE — Progress Notes (Signed)
CSW received consult for hx of Anxiety.  CSW met with [name] to offer support and complete assessment.    CSW met with [name] at bedside to discuss mental health history. [name] and infant, [name], were present at time of arrival. [name] stepped out to offer [name] privacy during assessment, however, after assessment, CSW invited [name] back into the room to participate in PPD and SIDS education.  [name] was pleasant and engaged during visit.   [name] reported brief history of anxiety during beginning of pregnancy. [name] denied any other mental health history. [name] explained anxiety was associated with hormones, nausea, and being overwhelmed by early pregnancy sx while working. [name] reported taking a leave from work for adjustment. [name] added she saw a counselor for 6 weeks and learned breathing and cognitive distancing techniques. [name] returned to work without return of anxiety sx. [name] reports counselor remains available PRN. [name] denied any SI, HI, or domestic violence. [name] identified her mood as "good". [name] reported support system as [name], mom, sister, brother, and [name]'s mom and dad.    CSW provided education regarding the baby blues period vs. perinatal mood disorders, discussed treatment and gave resources for mental health follow up if concerns arise.  CSW recommends self-evaluation during the postpartum time period using the New Mom Checklist from Postpartum Progress and encouraged [name] to contact a medical professional if symptoms are noted at any time. [name] and [name] asked appropriate questions and denied any additional questions.     CSW provided review of Sudden Infant Death Syndrome (SIDS) precautions. [name] confirmed having all needed items for baby including car seat, and safe sleeping areas; crib and bassinet.      CSW identifies no further need for intervention and no barriers to discharge at this time.  Natalie Davenport D. Sheilah Rayos, MSW, LCSWA Clinical Social Worker [phone] 

## 2019-07-30 NOTE — Anesthesia Postprocedure Evaluation (Signed)
Anesthesia Post Note  Patient: Rashika Bettes  Procedure(s) Performed: AN AD HOC LABOR EPIDURAL     Patient location during evaluation: Mother Baby Anesthesia Type: Epidural Level of consciousness: awake, oriented and awake and alert Pain management: pain level controlled Vital Signs Assessment: post-procedure vital signs reviewed and stable Respiratory status: spontaneous breathing Cardiovascular status: stable Postop Assessment: no headache, adequate PO intake, no backache, patient able to bend at knees, able to ambulate, no apparent nausea or vomiting and epidural receding Anesthetic complications: no    Last Vitals:  Vitals:   07/30/19 0100 07/30/19 0530  BP: 122/64 123/75  Pulse: 82 75  Resp: 17 18  Temp: 36.8 C 36.7 C  SpO2: 100% 98%    Last Pain:  Vitals:   07/30/19 0530  TempSrc: Oral  PainSc: 0-No pain   Pain Goal:                   Salome Arnt

## 2019-07-30 NOTE — Lactation Note (Signed)
This note was copied from a baby's chart. Lactation Consultation Note LC attempted to see mom, mom sleeping. Patient Name: [name] Date: 07/30/2019     Maternal Data    Feeding Feeding Type: Breast Fed  LATCH Score                   Interventions    Lactation Tools Discussed/Used     Consult Status      Natalie Davenport 07/30/2019, 3:27 AM

## 2019-07-30 NOTE — Lactation Note (Signed)
This note was copied from a baby's chart. Lactation Consultation Note Baby 6 hrs old. Mom has flat compressible nipples. Mom demonstrated hand expression w/flowing colostrum. Baby latches onto nipples well, mom states feels a little pinchy. encouraged mom to do chin or upper lip flanged. Mom states feels better. Baby is able to obtain deep latch when positioned correctly. Newborn behavior highlighted, STS, I&O, breast massage, supply and demand dicussed. Shells given strongly encouraged. Encouraged mom not to let her latch shallow. Encouraged to call for assistance or questions. Lactation brochure given.  Patient Name: Girl Dayzha Pogosyan FXTKW'I Date: 07/30/2019 Reason for consult: Initial assessment;Primapara;Term   Maternal Data Has patient been taught Hand Expression?: Yes Does the patient have breastfeeding experience prior to this delivery?: No  Feeding Feeding Type: Breast Fed  LATCH Score Latch: Repeated attempts needed to sustain latch, nipple held in mouth throughout feeding, stimulation needed to elicit sucking reflex.  Audible Swallowing: A few with stimulation  Type of Nipple: Flat  Comfort (Breast/Nipple): Filling, red/small blisters or bruises, mild/mod discomfort  Hold (Positioning): Assistance needed to correctly position infant at breast and maintain latch.  LATCH Score: 5  Interventions Interventions: Breast feeding basics reviewed;Support pillows;Assisted with latch;Position options;Skin to skin;Expressed milk;Breast massage;Hand express;Shells;Pre-pump if needed;Hand pump;Breast compression;Adjust position;Comfort gels  Lactation Tools Discussed/Used Tools: Shells;Pump Shell Type: Inverted Breast pump type: Manual WIC Program: No Pump Review: Setup, frequency, and cleaning;Milk Storage Initiated by:: Peri Jefferson RN IBCLC Date initiated:: 07/30/19   Consult Status Consult Status: Follow-up Date: 07/30/19 Follow-up type: In-patient    Charyl Dancer 07/30/2019, 5:09 AM

## 2019-07-31 MED ORDER — PRENATAL MULTIVITAMIN CH: 1.0000 | ORAL_TABLET | Freq: Every day | ORAL | 3 refills | Status: AC

## 2019-07-31 MED ORDER — IBUPROFEN 600 MG PO TABS: 600.0000 mg | ORAL_TABLET | Freq: Four times a day (QID) | ORAL | 1 refills | Status: DC | PRN

## 2019-07-31 NOTE — Lactation Note (Signed)
This note was copied from a baby's chart. Lactation Consultation Note Baby 31 hrs old. Mom in hopes to go home today. Mom has large breast w/flat nipples. Breast compressible w/colostrum noted. Mom asked for NS during the night d/t painful latching. Baby latches well in football hold. Size 20# fits well. Mom stated feels much better but is in hopes to BF w/o NS. Mom has been wearing her shells, stating helpful. Baby has lip tie, possible posterior tongue tie that could be causing pinching. Encouraged mom to pre-pump prior to latching. Informed mom if she isn't d/c home today to ask for DEBP d/t NS. Mom stated she would. Right now baby is cluster feeding, no time to pump. Baby is cluster feeding at this time. Encouraged mom to call for assistance or questions.  LC needs to see mom prior to leaving again.   Patient Name: Natalie Davenport SWNIO'E Date: 07/31/2019 Reason for consult: Follow-up assessment;Primapara;Term   Maternal Data    Feeding Feeding Type: Breast Fed  LATCH Score Latch: Repeated attempts needed to sustain latch, nipple held in mouth throughout feeding, stimulation needed to elicit sucking reflex.  Audible Swallowing: A few with stimulation  Type of Nipple: Flat  Comfort (Breast/Nipple): Filling, red/small blisters or bruises, mild/mod discomfort(bruise to Lt. nipple/sore)  Hold (Positioning): Assistance needed to correctly position infant at breast and maintain latch.  LATCH Score: 5  Interventions Interventions: Breast feeding basics reviewed;Support pillows;Assisted with latch;Position options;Skin to skin;Breast massage;Hand express;Shells;Pre-pump if needed;Hand pump  Lactation Tools Discussed/Used Tools: Shells;Pump;Nipple Shields Nipple shield size: 20 Shell Type: Inverted   Consult Status Consult Status: Follow-up Date: 07/31/19 Follow-up type: In-patient    [name], Diamond Nickel 07/31/2019, 6:01 AM

## 2019-07-31 NOTE — Progress Notes (Signed)
Post Partum Day 2 Subjective: no complaints, up ad lib, voiding, tolerating PO and nl lochia, pain controlled  Objective: Blood pressure (!) 109/96, pulse 88, temperature 98 F (36.7 C), temperature source Oral, resp. rate 16, height 5\' 7"  (1.702 m), weight 122.9 kg, SpO2 100 %, unknown if currently breastfeeding.  Physical Exam:  General: alert and no distress Lochia: appropriate Uterine Fundus: firm  Recent Labs    07/29/19 0020 07/30/19 0446  HGB 13.4 11.5*  HCT 38.9 34.0*    Assessment/Plan: Discharge home, Breastfeeding and Lactation consult.  D/C home with motrin and PNV.  F/u 6 weeks   LOS: 2 days   Natalie Davenport 07/31/2019, 9:02 AM

## 2019-07-31 NOTE — Lactation Note (Signed)
This note was copied from a baby's chart. Lactation Consultation Note  Patient Name: Natalie Davenport RSWNI'O Date: 07/31/2019 Reason for consult: Follow-up assessment Baby is 35 hours old/4% weight loss.  Mom reports feeling good about feedings.  She is feeling less pinching when using a 20 mm nipple shield.  71mm and 14mm nipple shields left with mom.  Discussed engorgement treatment.  Mom has a breast pump at home.  Instructed to begin some post pumping for breast stimulation.  Reviewed outpatient services and encouraged to call prn.  Maternal Data    Feeding Feeding Type: Breast Fed  LATCH Score Latch: Grasps breast easily, tongue down, lips flanged, rhythmical sucking.  Audible Swallowing: A few with stimulation  Type of Nipple: Flat  Comfort (Breast/Nipple): Filling, red/small blisters or bruises, mild/mod discomfort  Hold (Positioning): No assistance needed to correctly position infant at breast.  LATCH Score: 7  Interventions Interventions: Shells  Lactation Tools Discussed/Used Tools: Nipple Shields Nipple shield size: 20 Shell Type: Inverted   Consult Status Consult Status: Complete Date: 07/31/19 Follow-up type: Call as needed    Huston Foley 07/31/2019, 9:16 AM

## 2019-07-31 NOTE — Discharge Summary (Signed)
OB Discharge Summary     Patient Name: Natalie Davenport DOB: Nov 12, 1995 MRN: [id number]  Date of admission: 07/29/2019 Delivering MD: Janyth Contes   Date of discharge: 07/31/2019  Admitting diagnosis: Normal pregnancy in third trimester [Z34.93] Intrauterine pregnancy: [redacted]w[redacted]d     Secondary diagnosis:  Principal Problem:   SVD (spontaneous vaginal delivery) Active Problems:   Normal pregnancy in third trimester  Additional problems: N/A     Discharge diagnosis: Term Pregnancy Delivered                                                                                                Post partum procedures:N/A  Augmentation: AROM, Pitocin, Cytotec and Foley Balloon  Complications: None  Hospital course:  Induction of Labor With Vaginal Delivery   24 y.o. yo G1P1001 at [redacted]w[redacted]d was admitted to the hospital 07/29/2019 for induction of labor.  Indication for induction: Favorable cervix at term.  Patient had an uncomplicated labor course as follows: Membrane Rupture Time/Date: 1:23 PM ,07/29/2019   Intrapartum Procedures: Episiotomy:                                          Lacerations:  2nd degree [3];Labial [10]  Patient had delivery of a Viable infant.  Information for the patient's newborn:  Abcde, Oneil [[id number]]  Delivery Method: Vag-Spont    07/29/2019  Details of delivery can be found in separate delivery note.  Patient had a routine postpartum course. Patient is discharged home 07/31/19.  Physical exam  Vitals:   07/30/19 0830 07/30/19 1349 07/30/19 2003 07/31/19 0533  BP: 132/79 126/90 128/81 (!) 109/96  Pulse: 83 84 89 88  Resp: 18 18 17 16   Temp: 97.6 F (36.4 C) 98.1 F (36.7 C) 97.9 F (36.6 C) 98 F (36.7 C)  TempSrc: Oral Oral Oral Oral  SpO2: 99% 98% 100% 100%  Weight:      Height:       General: alert and no distress Lochia: appropriate Uterine Fundus: firm  Labs: Lab Results  Component Value Date   WBC 17.9 (H) 07/30/2019   HGB 11.5 (L)  07/30/2019   HCT 34.0 (L) 07/30/2019   MCV 89.7 07/30/2019   PLT 242 07/30/2019   CMP Latest Ref Rng & Units 07/29/2019  Glucose 70 - 99 mg/dL 102(H)  BUN 6 - 20 mg/dL 7  Creatinine 0.44 - 1.00 mg/dL 0.82  Sodium 135 - 145 mmol/L 136  Potassium 3.5 - 5.1 mmol/L 4.5  Chloride 98 - 111 mmol/L 105  CO2 22 - 32 mmol/L 20(L)  Calcium 8.9 - 10.3 mg/dL 9.0  Total Protein 6.5 - 8.1 g/dL 5.8(L)  Total Bilirubin 0.3 - 1.2 mg/dL 0.8  Alkaline Phos 38 - 126 U/L 171(H)  AST 15 - 41 U/L 22  ALT 0 - 44 U/L 25    Discharge instruction: per After Visit Summary and "Baby and Me Booklet".  After visit meds:  Allergies as of 07/31/2019      Reactions  Septra [sulfamethoxazole-trimethoprim] Rash      Medication List    STOP taking these medications   Bonjesta 20-20 MG Tbcr Generic drug: Doxylamine-Pyridoxine ER     TAKE these medications   calcium carbonate 500 MG chewable tablet Commonly known as: TUMS - dosed in mg elemental calcium Chew 2 tablets by mouth 3 (three) times daily as needed for indigestion or heartburn.   docusate sodium 100 MG capsule Commonly known as: Colace Take 1 capsule (100 mg total) by mouth 3 (three) times daily. What changed: when to take this   famotidine 20 MG tablet Commonly known as: PEPCID Take 20 mg by mouth 2 (two) times daily.   ibuprofen 600 MG tablet Commonly known as: ADVIL Take 1 tablet (600 mg total) by mouth every 6 (six) hours as needed.   prenatal multivitamin Tabs tablet Take 1 tablet by mouth at bedtime.       Diet: routine diet  Activity: Advance as tolerated. Pelvic rest for 6 weeks.   Outpatient follow up:6 weeks Follow up Appt:No future appointments. Follow up Visit:No follow-ups on file.  Postpartum contraception: Undecided  Newborn Data: Live born female  Birth Weight: 7 lb 9 oz (3430 g) APGAR: 7, 9  Newborn Delivery   Birth date/time: 07/29/2019 22:11:00 Delivery type: Vaginal, Spontaneous      Baby Feeding:  Breast Disposition:home with mother   07/31/2019 Sherian Rein, MD

## 2020-05-17 ENCOUNTER — Ambulatory Visit: Payer: BC Managed Care – PPO | Admitting: Orthopaedic Surgery

## 2020-05-17 ENCOUNTER — Other Ambulatory Visit: Payer: Self-pay

## 2020-05-17 ENCOUNTER — Encounter: Payer: Self-pay | Admitting: Orthopaedic Surgery

## 2020-05-17 ENCOUNTER — Ambulatory Visit: Payer: Self-pay

## 2020-05-17 VITALS — Ht 67.0 in | Wt 230.0 lb

## 2020-05-17 DIAGNOSIS — M25571 Pain in right ankle and joints of right foot: Secondary | ICD-10-CM

## 2020-05-17 NOTE — Progress Notes (Signed)
Office Visit Note   Patient: Natalie Davenport           Date of Birth: 1995/11/02           MRN: [id number] Visit Date: 05/17/2020              Requested by: No referring provider defined for this encounter. PCP: Patient, No Pcp Per   Assessment & Plan: Visit Diagnoses:  1. Pain in right ankle and joints of right foot     Plan: Impression is peroneal tendinitis and mild distal Achilles tendinitis.  Treatment options were discussed today with Natalie Davenport and we decided to use an ASO brace for immobilization as well as Voltaren gel and more regular use of ibuprofen.  If she feels that she needs something stronger such as a steroid Dosepak she will check with her OB/GYN first as she is still breast-feeding.  Questions encouraged and answered.  Follow-up as needed.  Follow-Up Instructions: Return if symptoms worsen or fail to improve.   Orders:  Orders Placed This Encounter  Procedures  . XR Ankle 2 Views Right   No orders of the defined types were placed in this encounter.     Procedures: No procedures performed   Clinical Data: No additional findings.   Subjective: Chief Complaint  Patient presents with  . Right Ankle - Pain    Natalie Davenport is a 24 year old female works as a Engineer, civil (consulting) in the [employer] comes in with 2 weeks of right lateral ankle pain without injury.  She attended her sister's wedding and wear heels and it made it feel better.  She has difficulty doing stairs because of the pain.  She has had some swelling.  She has noticed some slight improvement.  Ibuprofen helps temporarily.  She has not taking ibuprofen on a regular basis.  Denies any snapping or clicking or mechanical symptoms.   Review of Systems  Constitutional: Negative.   HENT: Negative.   Eyes: Negative.   Respiratory: Negative.   Cardiovascular: Negative.   Endocrine: Negative.   Musculoskeletal: Negative.   Neurological: Negative.   Hematological: Negative.   Psychiatric/Behavioral:  Negative.   All other systems reviewed and are negative.    Objective: Vital Signs: Ht 5\' 7"  (1.702 m)   Wt 230 lb (104.3 kg)   BMI 36.02 kg/m   Physical Exam Vitals and nursing note reviewed.  Constitutional:      Appearance: She is well-developed and well-nourished.  Pulmonary:     Effort: Pulmonary effort is normal.  Skin:    General: Skin is warm.     Capillary Refill: Capillary refill takes less than 2 seconds.  Neurological:     Mental Status: She is alert and oriented to person, place, and time.  Psychiatric:        Mood and Affect: Mood and affect normal.        Behavior: Behavior normal.        Thought Content: Thought content normal.        Judgment: Judgment normal.     Ortho Exam Right ankle shows mild swelling on the lateral aspect near the distal fibula.  Peroneal tendons are stable.  Strength is good.  The majority of the tenderness is along the course of the peroneals just distal to the tip of the lateral malleolus.  She has some mild tenderness at the lateral insertion of the Achilles.  Strength and range of motion are all well-preserved.  Mild pain with resisted peroneal activation. Specialty  Comments:  No specialty comments available.  Imaging: XR Ankle 2 Views Right  Result Date: 05/17/2020 No acute or structural abnormalities.    PMFS History: Patient Active Problem List   Diagnosis Date Noted  . Normal pregnancy in third trimester 07/29/2019  . SVD (spontaneous vaginal delivery) 07/29/2019  . Encounter for supervision of normal first pregnancy in third trimester 07/28/2019   Past Medical History:  Diagnosis Date  . Anxiety   . Encounter for supervision of normal first pregnancy in third trimester 07/28/2019  . Medical history non-contributory   . SVD (spontaneous vaginal delivery) 07/29/2019    Family History  Problem Relation Age of Onset  . Hyperlipidemia Maternal Aunt   . Hyperlipidemia Maternal Grandmother   . Hyperlipidemia  Maternal Grandfather   . Breast cancer Paternal Grandmother     Past Surgical History:  Procedure Laterality Date  . NO PAST SURGERIES    . WISDOM TOOTH EXTRACTION     Social History   Occupational History  . Not on file  Tobacco Use  . Smoking status: Never Smoker  . Smokeless tobacco: Never Used  Vaping Use  . Vaping Use: Never used  Substance and Sexual Activity  . Alcohol use: Not Currently  . Drug use: Never  . Sexual activity: Yes

## 2020-05-28 ENCOUNTER — Emergency Department
Admission: EM | Admit: 2020-05-28 | Discharge: 2020-05-28 | Disposition: A | Discharge status: Home / Self Care | Payer: BC Managed Care – PPO | Source: Home / Self Care

## 2020-05-28 ENCOUNTER — Other Ambulatory Visit: Payer: Self-pay

## 2020-05-28 DIAGNOSIS — R6889 Other general symptoms and signs: Secondary | ICD-10-CM

## 2020-05-28 DIAGNOSIS — N926 Irregular menstruation, unspecified: Secondary | ICD-10-CM

## 2020-05-28 DIAGNOSIS — Z20822 Contact with and (suspected) exposure to covid-19: Secondary | ICD-10-CM

## 2020-05-28 DIAGNOSIS — R112 Nausea with vomiting, unspecified: Secondary | ICD-10-CM

## 2020-05-28 LAB — POCT URINE PREGNANCY: Preg Test, Ur: NEGATIVE

## 2020-05-28 MED ORDER — ONDANSETRON 4 MG PO TBDP: 4.0000 mg | ORAL_TABLET | Freq: Three times a day (TID) | ORAL | 0 refills | Status: DC | PRN

## 2020-05-28 NOTE — ED Provider Notes (Signed)
[facility]    CSN: [id number] Arrival date & time: 05/28/20  0815      History   Chief Complaint Chief Complaint  Patient presents with  . Chills    HPI Natalie Davenport is a 25 y.o. female.   HPI  Natalie Davenport is a 25 y.o. female presenting to UC with c/o sudden onset chills and fever of 101*F in the middle of the night last night, associated generalized body aches, nausea and vomiting x 2.  Denies cough or congestion but states her husband started getting sick the end of last week, was seen at [facility] this weekend. COVID PCR test for him is still pending.  Pt and husband have taken at-home COVID tests, which were negative.  Pt notes she and husband were around her mother-in-law around the same time she tested positive for COVID on 05/15/20.  Pt has received her COVID vaccine but not the booster.  LMP: pt states it ended about 2 days ago but she is 10 months post-partum and breastfeeding. Her cycle has not regulated yet. She is unsure if she may be pregnant again.    Past Medical History:  Diagnosis Date  . Anxiety   . Encounter for supervision of normal first pregnancy in third trimester 07/28/2019  . Medical history non-contributory   . SVD (spontaneous vaginal delivery) 07/29/2019    Patient Active Problem List   Diagnosis Date Noted  . Normal pregnancy in third trimester 07/29/2019  . SVD (spontaneous vaginal delivery) 07/29/2019  . Encounter for supervision of normal first pregnancy in third trimester 07/28/2019    Past Surgical History:  Procedure Laterality Date  . NO PAST SURGERIES    . WISDOM TOOTH EXTRACTION      OB History    Gravida  1   Para  1   Term  1   Preterm      AB      Living  1     SAB      IAB      Ectopic      Multiple  0   Live Births  1            Home Medications    Prior to Admission medications   Medication Sig Start Date End Date Taking? Authorizing Provider  ondansetron (ZOFRAN-ODT) 4 MG  disintegrating tablet Take 1 tablet (4 mg total) by mouth every 8 (eight) hours as needed for nausea or vomiting. 05/28/20  Yes Areyana Leoni O, PA-C  calcium carbonate (TUMS - DOSED IN MG ELEMENTAL CALCIUM) 500 MG chewable tablet Chew 2 tablets by mouth 3 (three) times daily as needed for indigestion or heartburn.    [provider]  docusate sodium (COLACE) 100 MG capsule Take 1 capsule (100 mg total) by mouth 3 (three) times daily. Patient taking differently: Take 100 mg by mouth daily. 12/10/18   Marylene Land, CNM  famotidine (PEPCID) 20 MG tablet Take 20 mg by mouth 2 (two) times daily.    [provider]  ibuprofen (ADVIL) 600 MG tablet Take 1 tablet (600 mg total) by mouth every 6 (six) hours as needed. 07/31/19   Bovard-Stuckert, Augusto Gamble, MD  Prenatal Vit-Fe Fumarate-FA (PRENATAL MULTIVITAMIN) TABS tablet Take 1 tablet by mouth at bedtime. 07/31/19   Bovard-StuckertAugusto Gamble, MD    Family History Family History  Problem Relation Age of Onset  . Hyperlipidemia Maternal Aunt   . Hyperlipidemia Maternal Grandmother   . Hyperlipidemia Maternal Grandfather   .  Breast cancer Paternal Grandmother   . Healthy Mother   . Healthy Father     Social History Social History   Tobacco Use  . Smoking status: Never Smoker  . Smokeless tobacco: Never Used  Vaping Use  . Vaping Use: Never used  Substance Use Topics  . Alcohol use: Not Currently  . Drug use: Never     Allergies   Septra [sulfamethoxazole-trimethoprim]   Review of Systems Review of Systems  Constitutional: Positive for chills, fatigue and fever.  HENT: Negative for congestion, ear pain, sore throat, trouble swallowing and voice change.   Respiratory: Negative for cough and shortness of breath.   Cardiovascular: Negative for chest pain and palpitations.  Gastrointestinal: Positive for nausea and vomiting. Negative for abdominal pain and diarrhea.  Musculoskeletal: Positive for arthralgias, back  pain and myalgias.  Skin: Negative for rash.  All other systems reviewed and are negative.    Physical Exam Triage Vital Signs ED Triage Vitals  Enc Vitals Group     BP 05/28/20 0827 116/77     Pulse Rate 05/28/20 0827 84     Resp 05/28/20 0827 16     Temp 05/28/20 0827 99.1 F (37.3 C)     Temp Source 05/28/20 0827 Oral     SpO2 05/28/20 0827 100 %     Weight --      Height --      Head Circumference --      Peak Flow --      Pain Score 05/28/20 0826 7     Pain Loc --      Pain Edu? --      Excl. in GC? --    No data found.  Updated Vital Signs BP 116/77 (BP Location: Left Arm)   Pulse 84   Temp 99.1 F (37.3 C) (Oral)   Resp 16   SpO2 100%   Breastfeeding Yes   Visual Acuity Right Eye Distance:   Left Eye Distance:   Bilateral Distance:    Right Eye Near:   Left Eye Near:    Bilateral Near:     Physical Exam Vitals and nursing note reviewed.  Constitutional:      General: She is not in acute distress.    Appearance: Normal appearance. She is well-developed and well-nourished. She is not ill-appearing, toxic-appearing or diaphoretic.  HENT:     Head: Normocephalic and atraumatic.     Right Ear: Tympanic membrane and ear canal normal.     Left Ear: Tympanic membrane and ear canal normal.     Nose: Nose normal.     Right Sinus: No maxillary sinus tenderness or frontal sinus tenderness.     Left Sinus: No maxillary sinus tenderness or frontal sinus tenderness.     Mouth/Throat:     Lips: Pink.     Mouth: Mucous membranes are moist.     Pharynx: Oropharynx is clear. Uvula midline. No pharyngeal swelling, oropharyngeal exudate, posterior oropharyngeal erythema or uvula swelling.  Eyes:     Extraocular Movements: EOM normal.  Cardiovascular:     Rate and Rhythm: Normal rate and regular rhythm.  Pulmonary:     Effort: Pulmonary effort is normal. No respiratory distress.     Breath sounds: Normal breath sounds. No stridor. No wheezing, rhonchi or rales.   Abdominal:     General: There is no distension.     Palpations: Abdomen is soft.     Tenderness: There is no abdominal tenderness.  Musculoskeletal:  General: Normal range of motion.     Cervical back: Normal range of motion and neck supple.  Lymphadenopathy:     Cervical: No cervical adenopathy.  Skin:    General: Skin is warm and dry.  Neurological:     Mental Status: She is alert and oriented to person, place, and time.  Psychiatric:        Mood and Affect: Mood and affect normal.        Behavior: Behavior normal.      UC Treatments / Results  Labs (all labs ordered are listed, but only abnormal results are displayed) Labs Reviewed  COVID-19, FLU A+B NAA  POCT URINE PREGNANCY    EKG   Radiology No results found.  Procedures Procedures (including critical care time)  Medications Ordered in UC Medications - No data to display  Initial Impression / Assessment and Plan / UC Course  I have reviewed the triage vital signs and the nursing notes.  Pertinent labs & imaging results that were available during my care of the patient were reviewed by me and considered in my medical decision making (see chart for details).     Urine pregnancy: negative COVID/flu test pending Encouraged symptomatic tx F/u with PCP later this week if needed AVS given  Final Clinical Impressions(s) / UC Diagnoses   Final diagnoses:  Exposure to COVID-19 virus  Flu-like symptoms  Nausea and vomiting in adult  Irregular menses     Discharge Instructions      You may take 500mg  acetaminophen every 4-6 hours or in combination with ibuprofen 400-600mg  every 6-8 hours as needed for pain, inflammation, and fever.  Be sure to well hydrated with clear liquids and get at least 8 hours of sleep at night, preferably more while sick.   Please follow up with family medicine in 1 week if needed.     ED Prescriptions    Medication Sig Dispense Auth. Provider   ondansetron  (ZOFRAN-ODT) 4 MG disintegrating tablet Take 1 tablet (4 mg total) by mouth every 8 (eight) hours as needed for nausea or vomiting. 12 tablet , Lurene Shadow     PDMP not reviewed this encounter.   [place], Lurene Shadow 05/28/20 [id number]

## 2020-05-28 NOTE — ED Triage Notes (Signed)
Patient presents to Urgent Care with complaints of chills, fever, nausea, generalized body aches since she woke up this morinng. Patient reports she took a home covid test a week ago when her husband was sick (negative covid) and it was negative.

## 2020-05-28 NOTE — Discharge Instructions (Signed)
  You may take 500mg acetaminophen every 4-6 hours or in combination with ibuprofen 400-600mg every 6-8 hours as needed for pain, inflammation, and fever.  Be sure to well hydrated with clear liquids and get at least 8 hours of sleep at night, preferably more while sick.   Please follow up with family medicine in 1 week if needed.   

## 2020-06-02 ENCOUNTER — Telehealth: Payer: Self-pay | Admitting: Emergency Medicine

## 2020-06-02 NOTE — Telephone Encounter (Signed)
Patient called looking for her COVID results that were done on 05/28/2020.  Advised patient that her results are not back yet.  Patient does have access to mychart will check on there.  Patient advised that results are still in progress.

## 2020-06-05 LAB — COVID-19, FLU A+B NAA
Influenza A, NAA: NOT DETECTED
Influenza B, NAA: NOT DETECTED
SARS-CoV-2, NAA: DETECTED — AB

## 2020-12-20 LAB — OB RESULTS CONSOLE GC/CHLAMYDIA
Chlamydia: NEGATIVE
Gonorrhea: NEGATIVE

## 2020-12-20 LAB — OB RESULTS CONSOLE RUBELLA ANTIBODY, IGM: Rubella: IMMUNE

## 2020-12-20 LAB — OB RESULTS CONSOLE RPR: RPR: NONREACTIVE

## 2020-12-20 LAB — OB RESULTS CONSOLE HEPATITIS B SURFACE ANTIGEN: Hepatitis B Surface Ag: NEGATIVE

## 2020-12-20 LAB — OB RESULTS CONSOLE ABO/RH: RH Type: POSITIVE

## 2020-12-20 LAB — OB RESULTS CONSOLE ANTIBODY SCREEN: Antibody Screen: NEGATIVE

## 2020-12-20 LAB — HEPATITIS C ANTIBODY: HCV Ab: NEGATIVE

## 2020-12-20 LAB — OB RESULTS CONSOLE HIV ANTIBODY (ROUTINE TESTING): HIV: NONREACTIVE

## 2021-04-21 ENCOUNTER — Other Ambulatory Visit: Payer: Self-pay

## 2021-04-21 ENCOUNTER — Emergency Department (INDEPENDENT_AMBULATORY_CARE_PROVIDER_SITE_OTHER): Payer: BC Managed Care – PPO

## 2021-04-21 ENCOUNTER — Encounter: Payer: Self-pay | Admitting: Emergency Medicine

## 2021-04-21 ENCOUNTER — Emergency Department
Admission: EM | Admit: 2021-04-21 | Discharge: 2021-04-21 | Disposition: A | Discharge status: Home / Self Care | Payer: BC Managed Care – PPO | Source: Home / Self Care

## 2021-04-21 DIAGNOSIS — S92251B Displaced fracture of navicular [scaphoid] of right foot, initial encounter for open fracture: Secondary | ICD-10-CM

## 2021-04-21 DIAGNOSIS — M79671 Pain in right foot: Secondary | ICD-10-CM

## 2021-04-21 DIAGNOSIS — M25571 Pain in right ankle and joints of right foot: Secondary | ICD-10-CM | POA: Diagnosis not present

## 2021-04-21 NOTE — Discharge Instructions (Addendum)
Advised patient to wear postop shoe 24/7 except with bathing and sleeping.  Advised patient to follow-up with [facility] health orthopedic provider (contact information provided above) tomorrow Monday, 04/22/2021.  Advised patient may take OTC Ibuprofen 800 mg 1-2 times daily, as needed for right foot pain.

## 2021-04-21 NOTE — ED Provider Notes (Signed)
[facility]    CSN: [id number] Arrival date & time: 04/21/21  1457      History   Chief Complaint Chief Complaint  Patient presents with   Foot Pain   Ankle Pain    HPI Natalie Davenport is a 25 y.o. female.   HPI 25 year old female presents with right foot pain for 1 day.  Patient reports twisting her ankle and now having right foot pain.  Patient reports pain with bearing weight.  Past Medical History:  Diagnosis Date   Anxiety    Encounter for supervision of normal first pregnancy in third trimester 07/28/2019   Medical history non-contributory    SVD (spontaneous vaginal delivery) 07/29/2019    Patient Active Problem List   Diagnosis Date Noted   Normal pregnancy in third trimester 07/29/2019   SVD (spontaneous vaginal delivery) 07/29/2019   Encounter for supervision of normal first pregnancy in third trimester 07/28/2019    Past Surgical History:  Procedure Laterality Date   NO PAST SURGERIES     WISDOM TOOTH EXTRACTION      OB History     Gravida  2   Para  1   Term  1   Preterm      AB      Living  1      SAB      IAB      Ectopic      Multiple  0   Live Births  1            Home Medications    Prior to Admission medications   Medication Sig Start Date End Date Taking? Authorizing Provider  BONJESTA 20-20 MG TBCR Take 1 tablet by mouth 2 (two) times daily. 03/07/21  Yes [provider]  Prenatal Vit-Fe Fumarate-FA (PRENATAL MULTIVITAMIN) TABS tablet Take 1 tablet by mouth at bedtime. 07/31/19  Yes Bovard-Stuckert, Jody, MD  sertraline (ZOLOFT) 50 MG tablet Take 50 mg by mouth daily. 02/22/21  Yes [provider]  calcium carbonate (TUMS - DOSED IN MG ELEMENTAL CALCIUM) 500 MG chewable tablet Chew 2 tablets by mouth 3 (three) times daily as needed for indigestion or heartburn.    [provider]  docusate sodium (COLACE) 100 MG capsule Take 1 capsule (100 mg total) by mouth 3 (three) times  daily. Patient taking differently: Take 100 mg by mouth daily. 12/10/18   Marylene Land, CNM  famotidine (PEPCID) 20 MG tablet Take 20 mg by mouth 2 (two) times daily.    [provider]  ibuprofen (ADVIL) 600 MG tablet Take 1 tablet (600 mg total) by mouth every 6 (six) hours as needed. 07/31/19   Bovard-Stuckert, Augusto Gamble, MD  ondansetron (ZOFRAN-ODT) 4 MG disintegrating tablet Take 1 tablet (4 mg total) by mouth every 8 (eight) hours as needed for nausea or vomiting. 05/28/20   Lurene Shadow, PA-C    Family History Family History  Problem Relation Age of Onset   Hyperlipidemia Maternal Aunt    Hyperlipidemia Maternal Grandmother    Hyperlipidemia Maternal Grandfather    Breast cancer Paternal Grandmother    Healthy Mother    Healthy Father     Social History Social History   Tobacco Use   Smoking status: Never   Smokeless tobacco: Never  Vaping Use   Vaping Use: Never used  Substance Use Topics   Alcohol use: Not Currently   Drug use: Never     Allergies   Septra [sulfamethoxazole-trimethoprim]   Review  of Systems Review of Systems  Musculoskeletal:        Right foot pain x1 day    Physical Exam Triage Vital Signs ED Triage Vitals  Enc Vitals Group     BP 04/21/21 1547 117/84     Pulse Rate 04/21/21 1547 99     Resp 04/21/21 1547 16     Temp 04/21/21 1547 98.6 F (37 C)     Temp Source 04/21/21 1547 Oral     SpO2 04/21/21 1547 97 %     Weight --      Height --      Head Circumference --      Peak Flow --      Pain Score 04/21/21 1545 4     Pain Loc --      Pain Edu? --      Excl. in GC? --    No data found.  Updated Vital Signs BP 117/84 (BP Location: Right Arm)   Pulse 99   Temp 98.6 F (37 C) (Oral)   Resp 16   SpO2 97%    Physical Exam Vitals and nursing note reviewed.  Constitutional:      Appearance: Normal appearance. She is normal weight.  HENT:     Head: Normocephalic and atraumatic.     Mouth/Throat:      Mouth: Mucous membranes are moist.     Pharynx: Oropharynx is clear.  Eyes:     Extraocular Movements: Extraocular movements intact.     Conjunctiva/sclera: Conjunctivae normal.     Pupils: Pupils are equal, round, and reactive to light.  Cardiovascular:     Rate and Rhythm: Normal rate and regular rhythm.     Pulses: Normal pulses.     Heart sounds: Normal heart sounds.  Pulmonary:     Effort: Pulmonary effort is normal.     Breath sounds: Normal breath sounds.  Musculoskeletal:        General: Normal range of motion.  Skin:    General: Skin is warm and dry.  Neurological:     General: No focal deficit present.     Mental Status: She is alert and oriented to person, place, and time. Mental status is at baseline.     UC Treatments / Results  Labs (all labs ordered are listed, but only abnormal results are displayed) Labs Reviewed - No data to display  EKG   Radiology DG Ankle Complete Right  Result Date: 04/21/2021 CLINICAL DATA:  Injury, RIGHT lateral foot pain. EXAM: RIGHT ANKLE - COMPLETE 3+ VIEW COMPARISON:  None. FINDINGS: Osseous alignment is normal. Ankle mortise is symmetric. No fracture line or displaced fracture fragment. Visualized portions of the hindfoot and midfoot appear intact and normally aligned. Soft tissues about the RIGHT ankle are unremarkable. IMPRESSION: Negative. Electronically Signed   By: Bary Richard M.D.   On: 04/21/2021 16:30   DG Foot Complete Right  Result Date: 04/21/2021 CLINICAL DATA:  Injury, lateral foot pain. EXAM: RIGHT FOOT COMPLETE - 3+ VIEW COMPARISON:  None. FINDINGS: Osseous alignment is normal. No definite fracture line or displaced fracture fragment is seen. There is, however, faint lucency projected over the medial aspects of the navicular bone. This is most likely artifactual but can not be excluded as a nondisplaced fracture. Soft tissues about the RIGHT foot are unremarkable. IMPRESSION: 1. Faint lucency projected over the  medial aspects of the navicular bone. This is most likely artifactual but can not be excluded as a nondisplaced fracture line.  Any pain over the medial navicular region? If so, recommend follow-up plain film examination in 10-14 days to evaluate for healing fracture line. 2. Remainder of the osseous structures of the RIGHT foot appear intact and normally aligned. Electronically Signed   By: Bary Richard M.D.   On: 04/21/2021 16:35    Procedures Procedures (including critical care time)  Medications Ordered in UC Medications - No data to display  Initial Impression / Assessment and Plan / UC Course  I have reviewed the triage vital signs and the nursing notes.  Pertinent labs & imaging results that were available during my care of the patient were reviewed by me and considered in my medical decision making (see chart for details).     MDM: 1.  Right foot pain-right foot x-ray revealed above, patient placed in postop shoe prior to discharge, radiology was inconclusive of possible nondisplaced fracture of navicular bone.  Patient advised to wear postop shoe 24/7 except with bathing and sleeping and to follow-up with [place] orthopedic provider on Monday, 04/22/2021.  Final Clinical Impressions(s) / UC Diagnoses   Final diagnoses:  Right foot pain  Displaced fracture of navicular (scaphoid) of right foot, initial encounter for open fracture     Discharge Instructions      Advised patient to wear postop shoe 24/7 except with bathing and sleeping.  Advised patient to follow-up with [facility] orthopedic provider (contact information provided above) tomorrow Monday, 04/22/2021.  Advised patient may take OTC Ibuprofen 800 mg 1-2 times daily, as needed for right foot pain.     ED Prescriptions   None    PDMP not reviewed this encounter.   Trevor Iha, FNP 04/21/21 1656

## 2021-04-21 NOTE — ED Triage Notes (Signed)
Patient presents to Urgent Care with complaints of right foot pain since 1 day. Patient reports twisting her ankle. Now is having right foot pain. Having pain with bearing weight. Wearing slide ins. Currently in wheelchair due to pain

## 2021-05-19 NOTE — L&D Delivery Note (Signed)
Delivery Note ? ? ?Pt progressed rapidly to complete dilation and pushed great.  At 3:57 PM a healthy female was delivered via NSVD in the vertex presentation. (Presentation: ROA  ).  APGAR: 8, 9; weight pending.   ?Placenta status: delivered spontaneously with a few adherent clots..  Cord:   with the following complications: thin .  Baby required some suctioning immediately after birth but was fine to return skin to skin thereafter. ? ?Anesthesia:  Epidural ?Episiotomy:  none ?Lacerations:  1st degree perineal and right labial ?Suture Repair: 3.0 vicryl rapide ?Est. Blood Loss (mL):  119mL ? ?Mom to postpartum.  Baby to Couplet care / Skin to Skin. ? ?Natalie Davenport ?08/10/2021, 4:36 PM ? ? ? ?

## 2021-05-23 LAB — OB RESULTS CONSOLE RPR: RPR: NONREACTIVE

## 2021-05-23 LAB — OB RESULTS CONSOLE HIV ANTIBODY (ROUTINE TESTING): HIV: NONREACTIVE

## 2021-06-24 ENCOUNTER — Other Ambulatory Visit: Payer: Self-pay

## 2021-06-24 ENCOUNTER — Encounter: Payer: Self-pay | Admitting: Medical-Surgical

## 2021-06-24 ENCOUNTER — Ambulatory Visit: Payer: BC Managed Care – PPO | Admitting: Medical-Surgical

## 2021-06-24 VITALS — BP 112/75 | HR 89 | Resp 20 | Ht 67.0 in | Wt 257.0 lb

## 2021-06-24 DIAGNOSIS — F419 Anxiety disorder, unspecified: Secondary | ICD-10-CM

## 2021-06-24 DIAGNOSIS — Z7689 Persons encountering health services in other specified circumstances: Secondary | ICD-10-CM | POA: Diagnosis not present

## 2021-06-24 NOTE — Progress Notes (Signed)
New Patient Office Visit  Subjective:  Patient ID: Natalie Davenport, female    DOB: Sep 02, 1995  Age: 26 y.o. MRN: [id number]  CC:  Chief Complaint  Patient presents with   Establish Care     HPI Natalie Davenport presents to establish care.  She is a very pleasant 26 year old female who is currently 32 weeks and 5 days pregnant with her second child.  She has a 43-year-old little girl at home and is expecting another girl.  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  Currently taking a prenatal vitamin as instructed.  Anxiety-taking sertraline 50 mg daily, tolerating well without side effects.  Feels this is keeping her mood very stable.  Has been managed by OB/GYN.  Denies SI/HI.  Past Medical History:  Diagnosis Date   Anxiety    Encounter for supervision of normal first pregnancy in third trimester 07/28/2019   Medical history non-contributory    SVD (spontaneous vaginal delivery) 07/29/2019    Past Surgical History:  Procedure Laterality Date   NO PAST SURGERIES     WISDOM TOOTH EXTRACTION      Family History  Problem Relation Age of Onset   Hyperlipidemia Maternal Aunt    Alcohol abuse Maternal Aunt    Hyperlipidemia Maternal Grandmother    Hyperlipidemia Maternal Grandfather    Breast cancer Paternal Grandmother    Cancer Paternal Grandmother    Heart disease Paternal Grandmother    Healthy Mother    Anxiety disorder Mother    Healthy Father    Anxiety disorder Sister     Social History   Socioeconomic History   Marital status: Married    Spouse name: Not on file   Number of children: Not on file   Years of education: Not on file   Highest education level: Not on file  Occupational History   Not on file  Tobacco Use   Smoking status: Never   Smokeless tobacco: Never  Vaping Use   Vaping Use: Never used  Substance and Sexual Activity   Alcohol use: Not Currently    Comment: Social drinking only on occasion   Drug use: Never    Sexual activity: Yes    Birth control/protection: Coitus interruptus, Other-see comments    Comment: Currently pregnant, will use withdrawal and maybe the pill  Other Topics Concern   Not on file  Social History Narrative   Not on file   Social Determinants of Health   Financial Resource Strain: Not on file  Food Insecurity: Not on file  Transportation Needs: Not on file  Physical Activity: Not on file  Stress: Not on file  Social Connections: Not on file  Intimate Partner Violence: Not on file    ROS Review of Systems  Constitutional:  Negative for chills, fatigue, fever and unexpected weight change.  HENT:  Negative for congestion, rhinorrhea, sinus pressure and sore throat.   Respiratory:  Negative for cough, chest tightness and shortness of breath.   Cardiovascular:  Negative for chest pain, palpitations and leg swelling.  Gastrointestinal:  Negative for abdominal pain, constipation, diarrhea, nausea and vomiting.  Endocrine: Negative for cold intolerance and heat intolerance.  Genitourinary:  Negative for dysuria, frequency, urgency, vaginal bleeding and vaginal discharge.  Skin:  Negative for rash and wound.  Neurological:  Negative for dizziness, light-headedness and headaches.  Hematological:  Does not bruise/bleed easily.  Psychiatric/Behavioral:  Negative for dysphoric mood, self-injury, sleep disturbance and suicidal ideas. The patient is not  nervous/anxious.    Objective:   Today's Vitals: BP 112/75 (BP Location: Left Arm, Patient Position: Sitting, Cuff Size: Normal)    Pulse 89    Resp 20    Ht 5\' 7"  (1.702 m)    Wt 257 lb (116.6 kg)    SpO2 99%    BMI 40.25 kg/m   Physical Exam Vitals reviewed.  Constitutional:      General: She is not in acute distress.    Appearance: Normal appearance. She is not ill-appearing.  HENT:     Head: Normocephalic and atraumatic.  Cardiovascular:     Rate and Rhythm: Normal rate and regular rhythm.     Pulses: Normal pulses.      Heart sounds: Normal heart sounds. No murmur heard.   No friction rub. No gallop.  Pulmonary:     Effort: Pulmonary effort is normal. No respiratory distress.     Breath sounds: Normal breath sounds. No wheezing.  Skin:    General: Skin is warm and dry.  Neurological:     Mental Status: She is alert and oriented to person, place, and time.  Psychiatric:        Mood and Affect: Mood normal.        Behavior: Behavior normal.        Thought Content: Thought content normal.        Judgment: Judgment normal.    Assessment & Plan:   1. Encounter to establish care Reviewed available information and discussed care concerns with patient.   2. Anxiety Symptoms well managed on Zoloft.  Continue 50 mg daily as prescribed.  Advised that we will be glad to manage this that should her OB/GYN be uncomfortable with managing mood medications after delivery   Outpatient Encounter Medications as of 06/24/2021  Medication Sig   Prenatal Vit-Fe Fumarate-FA (PRENATAL MULTIVITAMIN) TABS tablet Take 1 tablet by mouth at bedtime.   sertraline (ZOLOFT) 50 MG tablet Take 50 mg by mouth daily.   [DISCONTINUED] BONJESTA 20-20 MG TBCR Take 1 tablet by mouth 2 (two) times daily.   [DISCONTINUED] calcium carbonate (TUMS - DOSED IN MG ELEMENTAL CALCIUM) 500 MG chewable tablet Chew 2 tablets by mouth 3 (three) times daily as needed for indigestion or heartburn.   [DISCONTINUED] docusate sodium (COLACE) 100 MG capsule Take 1 capsule (100 mg total) by mouth 3 (three) times daily. (Patient taking differently: Take 100 mg by mouth daily.)   [DISCONTINUED] famotidine (PEPCID) 20 MG tablet Take 20 mg by mouth 2 (two) times daily.   [DISCONTINUED] ibuprofen (ADVIL) 600 MG tablet Take 1 tablet (600 mg total) by mouth every 6 (six) hours as needed.   [DISCONTINUED] ondansetron (ZOFRAN-ODT) 4 MG disintegrating tablet Take 1 tablet (4 mg total) by mouth every 8 (eight) hours as needed for nausea or vomiting.   No  facility-administered encounter medications on file as of 06/24/2021.    Follow-up: Return if symptoms worsen or fail to improve.   08/22/2021, DNP, APRN, FNP-BC [facility]

## 2021-07-18 LAB — OB RESULTS CONSOLE GBS: GBS: NEGATIVE

## 2021-08-01 ENCOUNTER — Telehealth (HOSPITAL_COMMUNITY): Payer: Self-pay | Admitting: *Deleted

## 2021-08-01 ENCOUNTER — Encounter (HOSPITAL_COMMUNITY): Payer: Self-pay | Admitting: *Deleted

## 2021-08-01 NOTE — Telephone Encounter (Signed)
Preadmission screen  

## 2021-08-05 ENCOUNTER — Telehealth (HOSPITAL_COMMUNITY): Payer: Self-pay | Admitting: *Deleted

## 2021-08-05 NOTE — Telephone Encounter (Signed)
Preadmission screen  

## 2021-08-07 ENCOUNTER — Encounter (HOSPITAL_COMMUNITY): Payer: Self-pay | Admitting: Obstetrics and Gynecology

## 2021-08-09 ENCOUNTER — Other Ambulatory Visit: Payer: Self-pay

## 2021-08-09 ENCOUNTER — Other Ambulatory Visit: Payer: Self-pay | Admitting: Obstetrics and Gynecology

## 2021-08-10 ENCOUNTER — Inpatient Hospital Stay (HOSPITAL_COMMUNITY)
Admission: AD | Admit: 2021-08-10 | Discharge: 2021-08-11 | DRG: 807 | Disposition: A | Discharge status: Home / Self Care | Payer: BC Managed Care – PPO | Attending: Obstetrics and Gynecology | Admitting: Obstetrics and Gynecology

## 2021-08-10 ENCOUNTER — Inpatient Hospital Stay (HOSPITAL_COMMUNITY): Payer: BC Managed Care – PPO | Admitting: Anesthesiology

## 2021-08-10 ENCOUNTER — Inpatient Hospital Stay (HOSPITAL_COMMUNITY): Payer: BC Managed Care – PPO

## 2021-08-10 ENCOUNTER — Encounter (HOSPITAL_COMMUNITY): Payer: Self-pay | Admitting: Obstetrics and Gynecology

## 2021-08-10 DIAGNOSIS — F419 Anxiety disorder, unspecified: Secondary | ICD-10-CM | POA: Diagnosis present

## 2021-08-10 DIAGNOSIS — O26893 Other specified pregnancy related conditions, third trimester: Secondary | ICD-10-CM | POA: Diagnosis present

## 2021-08-10 DIAGNOSIS — O99344 Other mental disorders complicating childbirth: Secondary | ICD-10-CM | POA: Diagnosis present

## 2021-08-10 DIAGNOSIS — Z3A39 39 weeks gestation of pregnancy: Secondary | ICD-10-CM

## 2021-08-10 DIAGNOSIS — F32A Depression, unspecified: Secondary | ICD-10-CM | POA: Diagnosis present

## 2021-08-10 LAB — CBC
HCT: 37.5 % (ref 36.0–46.0)
Hemoglobin: 12.9 g/dL (ref 12.0–15.0)
MCH: 29.5 pg (ref 26.0–34.0)
MCHC: 34.4 g/dL (ref 30.0–36.0)
MCV: 85.8 fL (ref 80.0–100.0)
Platelets: 317 10*3/uL (ref 150–400)
RBC: 4.37 MIL/uL (ref 3.87–5.11)
RDW: 12.8 % (ref 11.5–15.5)
WBC: 12.7 10*3/uL — ABNORMAL HIGH (ref 4.0–10.5)
nRBC: 0 % (ref 0.0–0.2)

## 2021-08-10 LAB — TYPE AND SCREEN
ABO/RH(D): O POS
Antibody Screen: NEGATIVE

## 2021-08-10 LAB — RPR: RPR Ser Ql: NONREACTIVE

## 2021-08-10 MED ORDER — LIDOCAINE HCL (PF) 1 % IJ SOLN: 30.0000 mL | INTRAMUSCULAR | Status: DC | PRN

## 2021-08-10 MED ORDER — MISOPROSTOL 25 MCG QUARTER TABLET
25.0000 ug | ORAL_TABLET | Freq: Four times a day (QID) | ORAL | Status: AC
  Administered 2021-08-10 (×2): 25 ug via VAGINAL
  Filled 2021-08-10 (×2): qty 1

## 2021-08-10 MED ORDER — PHENYLEPHRINE 40 MCG/ML (10ML) SYRINGE FOR IV PUSH (FOR BLOOD PRESSURE SUPPORT): 80.0000 ug | PREFILLED_SYRINGE | INTRAVENOUS | Status: DC | PRN

## 2021-08-10 MED ORDER — DIPHENHYDRAMINE HCL 25 MG PO CAPS: 25.0000 mg | ORAL_CAPSULE | Freq: Four times a day (QID) | ORAL | Status: DC | PRN

## 2021-08-10 MED ORDER — OXYTOCIN-SODIUM CHLORIDE 30-0.9 UT/500ML-% IV SOLN
1.0000 m[IU]/min | INTRAVENOUS | Status: DC
  Administered 2021-08-10: 2 m[IU]/min via INTRAVENOUS
  Filled 2021-08-10: qty 500

## 2021-08-10 MED ORDER — LIDOCAINE-EPINEPHRINE (PF) 2 %-1:200000 IJ SOLN
INTRAMUSCULAR | Status: DC | PRN
  Administered 2021-08-10: 3 mL via EPIDURAL

## 2021-08-10 MED ORDER — BUTORPHANOL TARTRATE 1 MG/ML IJ SOLN: 1.0000 mg | INTRAMUSCULAR | Status: DC | PRN

## 2021-08-10 MED ORDER — ACETAMINOPHEN 325 MG PO TABS
650.0000 mg | ORAL_TABLET | ORAL | Status: DC | PRN
  Administered 2021-08-11 (×2): 650 mg via ORAL
  Filled 2021-08-10 (×2): qty 2

## 2021-08-10 MED ORDER — TERBUTALINE SULFATE 1 MG/ML IJ SOLN: 0.2500 mg | Freq: Once | INTRAMUSCULAR | Status: DC | PRN

## 2021-08-10 MED ORDER — PRENATAL MULTIVITAMIN CH
1.0000 | ORAL_TABLET | Freq: Every day | ORAL | Status: DC
  Administered 2021-08-11: 1 via ORAL
  Filled 2021-08-10: qty 1

## 2021-08-10 MED ORDER — ZOLPIDEM TARTRATE 5 MG PO TABS: 5.0000 mg | ORAL_TABLET | Freq: Every evening | ORAL | Status: DC | PRN

## 2021-08-10 MED ORDER — OXYTOCIN BOLUS FROM INFUSION
333.0000 mL | Freq: Once | INTRAVENOUS | Status: AC
  Administered 2021-08-10: 333 mL via INTRAVENOUS

## 2021-08-10 MED ORDER — SOD CITRATE-CITRIC ACID 500-334 MG/5ML PO SOLN
30.0000 mL | ORAL | Status: DC | PRN
Start: 2021-08-10 — End: 2021-08-10

## 2021-08-10 MED ORDER — ACETAMINOPHEN 325 MG PO TABS: 650.0000 mg | ORAL_TABLET | ORAL | Status: DC | PRN

## 2021-08-10 MED ORDER — LACTATED RINGERS IV SOLN: 500.0000 mL | INTRAVENOUS | Status: DC | PRN

## 2021-08-10 MED ORDER — SERTRALINE HCL 50 MG PO TABS
50.0000 mg | ORAL_TABLET | Freq: Every day | ORAL | Status: DC
Start: 2021-08-10 — End: 2021-08-11
  Administered 2021-08-10: 50 mg via ORAL
  Filled 2021-08-10 (×2): qty 1

## 2021-08-10 MED ORDER — BENZOCAINE-MENTHOL 20-0.5 % EX AERO
1.0000 "application " | INHALATION_SPRAY | CUTANEOUS | Status: DC | PRN
  Filled 2021-08-10: qty 56

## 2021-08-10 MED ORDER — OXYCODONE-ACETAMINOPHEN 5-325 MG PO TABS: 2.0000 | ORAL_TABLET | ORAL | Status: DC | PRN

## 2021-08-10 MED ORDER — WITCH HAZEL-GLYCERIN EX PADS: 1.0000 "application " | MEDICATED_PAD | CUTANEOUS | Status: DC | PRN

## 2021-08-10 MED ORDER — EPHEDRINE 5 MG/ML INJ: 10.0000 mg | INTRAVENOUS | Status: DC | PRN

## 2021-08-10 MED ORDER — OXYCODONE-ACETAMINOPHEN 5-325 MG PO TABS: 1.0000 | ORAL_TABLET | ORAL | Status: DC | PRN

## 2021-08-10 MED ORDER — LACTATED RINGERS IV SOLN
500.0000 mL | Freq: Once | INTRAVENOUS | Status: AC
  Administered 2021-08-10: 500 mL via INTRAVENOUS

## 2021-08-10 MED ORDER — DIBUCAINE (PERIANAL) 1 % EX OINT: 1.0000 "application " | TOPICAL_OINTMENT | CUTANEOUS | Status: DC | PRN

## 2021-08-10 MED ORDER — SIMETHICONE 80 MG PO CHEW: 80.0000 mg | CHEWABLE_TABLET | ORAL | Status: DC | PRN

## 2021-08-10 MED ORDER — LACTATED RINGERS IV SOLN: INTRAVENOUS | Status: DC

## 2021-08-10 MED ORDER — ONDANSETRON HCL 4 MG/2ML IJ SOLN
4.0000 mg | Freq: Four times a day (QID) | INTRAMUSCULAR | Status: DC | PRN
  Administered 2021-08-10: 4 mg via INTRAVENOUS
  Filled 2021-08-10: qty 2

## 2021-08-10 MED ORDER — PHENYLEPHRINE 40 MCG/ML (10ML) SYRINGE FOR IV PUSH (FOR BLOOD PRESSURE SUPPORT)
80.0000 ug | PREFILLED_SYRINGE | INTRAVENOUS | Status: DC | PRN
  Filled 2021-08-10: qty 10

## 2021-08-10 MED ORDER — ONDANSETRON HCL 4 MG/2ML IJ SOLN
4.0000 mg | INTRAMUSCULAR | Status: DC | PRN
Start: 2021-08-10 — End: 2021-08-11

## 2021-08-10 MED ORDER — COCONUT OIL OIL
1.0000 "application " | TOPICAL_OIL | Status: DC | PRN
  Administered 2021-08-11: 1 via TOPICAL

## 2021-08-10 MED ORDER — ONDANSETRON HCL 4 MG PO TABS: 4.0000 mg | ORAL_TABLET | ORAL | Status: DC | PRN

## 2021-08-10 MED ORDER — IBUPROFEN 600 MG PO TABS
600.0000 mg | ORAL_TABLET | Freq: Four times a day (QID) | ORAL | Status: DC
  Administered 2021-08-10 – 2021-08-11 (×4): 600 mg via ORAL
  Filled 2021-08-10 (×4): qty 1

## 2021-08-10 MED ORDER — OXYTOCIN-SODIUM CHLORIDE 30-0.9 UT/500ML-% IV SOLN
2.5000 [IU]/h | INTRAVENOUS | Status: DC
  Administered 2021-08-10: 2.5 [IU]/h via INTRAVENOUS

## 2021-08-10 MED ORDER — FENTANYL-BUPIVACAINE-NACL 0.5-0.125-0.9 MG/250ML-% EP SOLN
12.0000 mL/h | EPIDURAL | Status: DC | PRN
  Administered 2021-08-10: 12 mL/h via EPIDURAL
  Filled 2021-08-10: qty 250

## 2021-08-10 MED ORDER — BUPIVACAINE HCL (PF) 0.25 % IJ SOLN
INTRAMUSCULAR | Status: DC | PRN
  Administered 2021-08-10 (×2): 3 mL via EPIDURAL

## 2021-08-10 MED ORDER — DIPHENHYDRAMINE HCL 50 MG/ML IJ SOLN: 12.5000 mg | INTRAMUSCULAR | Status: DC | PRN

## 2021-08-10 MED ORDER — SENNOSIDES-DOCUSATE SODIUM 8.6-50 MG PO TABS
2.0000 | ORAL_TABLET | ORAL | Status: DC
  Administered 2021-08-10: 2 via ORAL
  Filled 2021-08-10: qty 2

## 2021-08-10 MED ORDER — TETANUS-DIPHTH-ACELL PERTUSSIS 5-2.5-18.5 LF-MCG/0.5 IM SUSY: 0.5000 mL | PREFILLED_SYRINGE | Freq: Once | INTRAMUSCULAR | Status: DC

## 2021-08-10 NOTE — H&P (Signed)
Natalie Davenport is a 26 y.o. female G2P1001 at 63 3/7 weeks (EDD 08/14/21 by 9 week [place] inconsistent with LMP) presenting for elective IOL. ?Prenatal care significant for anxiety/depression controlled on zoloft and maternal obesity. ? ?OB History   ? ? Gravida  ?2  ? Para  ?1  ? Term  ?1  ? Preterm  ?   ? AB  ?   ? Living  ?1  ?  ? ? SAB  ?   ? IAB  ?   ? Ectopic  ?   ? Multiple  ?0  ? Live Births  ?1  ?   ?  ?  ?07-29-2019, 39.3 wks ?F, 7lbs 9oz, Vaginal Delivery ? ?Past Medical History:  ?Diagnosis Date  ? Anxiety   ? Encounter for supervision of normal first pregnancy in third trimester 07/28/2019  ? SVD (spontaneous vaginal delivery) 07/29/2019  ? ?Past Surgical History:  ?Procedure Laterality Date  ? WISDOM TOOTH EXTRACTION    ? ?Family History: family history includes Alcohol abuse in her maternal aunt; Anxiety disorder in her mother and sister; Breast cancer in her paternal grandmother; Cancer in her paternal grandmother; Healthy in her father and mother; Heart disease in her paternal grandmother; Hyperlipidemia in her maternal aunt, maternal grandfather, and maternal grandmother. ?Social History:  reports that she has never smoked. She has never used smokeless tobacco. She reports that she does not currently use alcohol. She reports that she does not use drugs. ? ? ?  ?Maternal Diabetes: No ?Genetic Screening: Normal ?Maternal Ultrasounds/Referrals: Normal ?Fetal Ultrasounds or other Referrals:  None ?Maternal Substance Abuse:  No ?Significant Maternal Medications:  Meds include: Zoloft ?Significant Maternal Lab Results:  Group B Strep negative ?Other Comments:  None ? ?Review of Systems  ?Constitutional:  Negative for fever.  ?Gastrointestinal:  Negative for abdominal pain.  ?Maternal Medical History:  ?Contractions: Frequency: irregular.   ?Perceived severity is mild.   ?Fetal activity: Perceived fetal activity is normal.   ?Prenatal complications: Anxiety, obesity ?Prenatal Complications - Diabetes:  none. ? ?Dilation: 2 ?Effacement (%): 50 ?Station: -2 ?Exam by:: Quin Hoop, RN ?Blood pressure 132/83, pulse 78, temperature 97.6 ?F (36.4 ?C), temperature source Oral, resp. rate 18, height 5\' 7"  (1.702 m), weight 117.6 kg, currently breastfeeding. ?Maternal Exam:  ?Uterine Assessment: Contraction strength is mild.  Contraction frequency is irregular.  ?Abdomen: Patient reports no abdominal tenderness. Fetal presentation: vertex ?Introitus: Normal vulva. Normal vagina.   ?Physical Exam ?Constitutional:   ?   Appearance: She is obese.  ?Cardiovascular:  ?   Rate and Rhythm: Normal rate and regular rhythm.  ?Pulmonary:  ?   Effort: Pulmonary effort is normal.  ?Abdominal:  ?   Palpations: Abdomen is soft.  ?Genitourinary: ?   General: Normal vulva.  ?Neurological:  ?   General: No focal deficit present.  ?   Mental Status: She is alert.  ?Psychiatric:     ?   Behavior: Behavior normal.  ?  ?Prenatal labs: ?ABO, Rh: --/--/O POS (03/25 0036) ?Antibody: NEG (03/25 0036) ?Rubella: Immune (08/04 0000) ?RPR: Nonreactive (01/05 0000)  ?HBsAg: Negative (08/04 0000)  ?HIV: Non-reactive (01/05 0000)  ?GBS: Negative/-- (03/02 0000)  ?NIPT low risk, female ?One hour GCT 79 ?Carrier screen negative 2020 x 3 ? ?Assessment/Plan: ?Pt for cytotec ripening and then AROM, Pitocin when able.  ?Epidural as desired  ? ?03-26-1994 ?08/10/2021, 6:45 AM ? ? ? ? ?

## 2021-08-10 NOTE — Progress Notes (Signed)
Patient ID: Natalie Davenport, female   DOB: [birth year], 26 y.o.   MRN: [id number] ?Getting more uncomfortable ? ?Afeb vss ?FHR category 1 ? ?80/3/-2 ?AROM clear ? ?Pitocin @ 13mu titrate as needed ?Epidural when desires ?

## 2021-08-10 NOTE — Progress Notes (Signed)
Patient ID: Natalie Davenport, female   DOB: Apr 30, 1996, 26 y.o.   MRN: [id number] ?Pt received second cytotec and was 2cm at 600am ?Feeling mild cramping ? ?Afeb VSS ?FHR category 1 ? ?Cervical exam deferred ? ?Will start pitocin at 1000am ?Assess for AROM in a few hours ?

## 2021-08-10 NOTE — Progress Notes (Signed)
Patient ID: Natalie Davenport, female   DOB: Jun 29, 1995, 26 y.o.   MRN: [id number] ?Pt comfortable with epidural ? ?FHR overall category one with intermittent mild variables related to position ? ?80/3-4/-1 ?IUPC placed at 1320 to help titrate pitocin ?Follow progess ?

## 2021-08-10 NOTE — Anesthesia Procedure Notes (Signed)
Epidural ?Patient location during procedure: OB ?Start time: 08/10/2021 12:26 PM ?End time: 08/10/2021 12:42 PM ? ?Staffing ?Anesthesiologist: Oleta Mouse, MD ?Performed: anesthesiologist  ? ?Preanesthetic Checklist ?Completed: patient identified, IV checked, risks and benefits discussed, monitors and equipment checked, pre-op evaluation and timeout performed ? ?Epidural ?Patient position: sitting ?Prep: DuraPrep ?Patient monitoring: heart rate, continuous pulse ox and blood pressure ?Approach: midline ?Location: L3-L4 ?Injection technique: LOR saline ? ?Needle:  ?Needle type: Tuohy  ?Needle gauge: 17 G ?Needle length: 9 cm ?Needle insertion depth: 7 cm ?Catheter type: closed end flexible ?Catheter size: 19 Gauge ?Catheter at skin depth: 14 cm ?Test dose: negative and 2% lidocaine with Epi 1:200 K ? ?Assessment ?Events: blood not aspirated, injection not painful, no injection resistance, no paresthesia and negative IV test ? ? ? ?

## 2021-08-10 NOTE — Anesthesia Preprocedure Evaluation (Signed)
Anesthesia Evaluation  ?Patient identified by MRN, date of birth, ID band ?Patient awake ? ? ? ?Reviewed: ?Allergy & Precautions, Patient's Chart, lab work & pertinent test results ? ?History of Anesthesia Complications ?Negative for: history of anesthetic complications ? ?Airway ?Mallampati: III ? ?TM Distance: >3 FB ?Neck ROM: Full ? ? ? Dental ? ?(+) Dental Advisory Given ?  ?Pulmonary ?neg pulmonary ROS,  ?  ?breath sounds clear to auscultation ? ? ? ? ? ? Cardiovascular ?negative cardio ROS ? ? ?Rhythm:Regular  ? ?  ?Neuro/Psych ?PSYCHIATRIC DISORDERS Anxiety negative neurological ROS ?   ? GI/Hepatic ?negative GI ROS, Neg liver ROS,   ?Endo/Other  ?negative endocrine ROS ? Renal/GU ?negative Renal ROS  ? ?  ?Musculoskeletal ? ? Abdominal ?  ?Peds ? Hematology ?negative hematology ROS ?(+) Lab Results ?     Component                Value               Date                 ?     WBC                      12.7 (H)            08/10/2021           ?     HGB                      12.9                08/10/2021           ?     HCT                      37.5                08/10/2021           ?     MCV                      85.8                08/10/2021           ?     PLT                      317                 08/10/2021           ? ?Denies blood thinners   ?Anesthesia Other Findings ? ? Reproductive/Obstetrics ?(+) Pregnancy ? ?  ? ? ? ? ? ? ? ? ? ? ? ? ? ?  ?  ? ? ? ? ? ? ? ? ?Anesthesia Physical ?Anesthesia Plan ? ?ASA: 2 ? ?Anesthesia Plan: Epidural  ? ?Post-op Pain Management:   ? ?Induction:  ? ?PONV Risk Score and Plan: 2 and Treatment may vary due to age or medical condition ? ?Airway Management Planned: Natural Airway ? ?Additional Equipment:  ? ?Intra-op Plan:  ? ?Post-operative Plan:  ? ?Informed Consent: I have reviewed the patients History and Physical, chart, labs and discussed the procedure including the risks, benefits and alternatives for the proposed anesthesia with  the patient or authorized representative who has indicated his/her understanding and  acceptance.  ? ? ? ? ? ?Plan Discussed with: Anesthesiologist ? ?Anesthesia Plan Comments:   ? ? ? ? ? ? ?Anesthesia Quick Evaluation ? ?

## 2021-08-11 LAB — CBC
HCT: 36.4 % (ref 36.0–46.0)
Hemoglobin: 12.2 g/dL (ref 12.0–15.0)
MCH: 29 pg (ref 26.0–34.0)
MCHC: 33.5 g/dL (ref 30.0–36.0)
MCV: 86.7 fL (ref 80.0–100.0)
Platelets: 289 10*3/uL (ref 150–400)
RBC: 4.2 MIL/uL (ref 3.87–5.11)
RDW: 13.1 % (ref 11.5–15.5)
WBC: 16.6 10*3/uL — ABNORMAL HIGH (ref 4.0–10.5)
nRBC: 0 % (ref 0.0–0.2)

## 2021-08-11 MED ORDER — ACETAMINOPHEN 325 MG PO TABS: 650.0000 mg | ORAL_TABLET | ORAL | 1 refills | Status: DC | PRN

## 2021-08-11 MED ORDER — IBUPROFEN 600 MG PO TABS: 600.0000 mg | ORAL_TABLET | Freq: Four times a day (QID) | ORAL | 0 refills | Status: DC

## 2021-08-11 NOTE — Lactation Note (Signed)
This note was copied from a baby's chart. ?Lactation Consultation Note ? ?Patient Name: Girl Loistine Eberlin ?Today's Date: 08/11/2021 ?Reason for consult: Initial assessment ?Age:28 hours ? ?P2, Ex BF.  Mother states bf is going well and denies questions or concerns.  ?Discussed basics.  Mom made aware of O/P services, breastfeeding support groups,and our phone # for post-discharge questions.  ? ? ?Maternal Data ?Has patient been taught Hand Expression?: Yes ?Does the patient have breastfeeding experience prior to this delivery?: Yes ?How long did the patient breastfeed?: 20 mos. ? ?Feeding ?Mother's Current Feeding Choice: Breast Milk ? ?Interventions ?Interventions: Breast feeding basics reviewed;LC Services brochure;Education ? ?Discharge ?  ? ?Consult Status ?Consult Status: Follow-up ?Date: 08/12/21 ?Follow-up type: In-patient ? ? ? ?Dahlia Byes Boschen ?08/11/2021, 9:11 AM ? ? ? ?

## 2021-08-11 NOTE — Social Work (Signed)
[name] was referred for history of depression and anxiety.  ? ?* Referral screened out by Clinical Social Worker because none of the following criteria appear to apply: ? ?~ History of anxiety/depression during this pregnancy, or of post-partum depression following prior delivery. ?~ Diagnosis of anxiety and/or depression within last 3 years. ?OR ?* [name]'s symptoms currently being treated with medication and/or therapy. [name] currently prescribed Zoloft 50mg . ? ?[name] scored a 1 on the Postpartum Depression Screen. ? ?Please contact the Clinical Social Worker if needs arise or by [facility] [address] request. ? ?[facility], LCSW ?Clinical Social Work ?[facility]  ?[phone]  ?

## 2021-08-11 NOTE — Progress Notes (Signed)
Post Partum Day 1 ?Subjective: ?no complaints, up ad lib, and tolerating PO  Would like early d/c this PM ? ?Objective: ?Blood pressure 125/84, pulse 74, temperature 97.9 ?F (36.6 ?C), temperature source Oral, resp. rate 16, height 5\' 7"  (1.702 m), weight 117.6 kg, SpO2 99 %, unknown if currently breastfeeding. ? ?Physical Exam:  ?General: alert and cooperative ?Lochia: appropriate ?Uterine Fundus: firm ? ? ?Recent Labs  ?  08/10/21 ?0036 08/11/21 ?0354  ?HGB 12.9 12.2  ?HCT 37.5 36.4  ? ? ?Assessment/Plan: ?Discharge home ? ? LOS: 1 day  ? ?08/13/21 ?08/11/2021, 11:19 AM  ? ? ?

## 2021-08-11 NOTE — Anesthesia Postprocedure Evaluation (Signed)
Anesthesia Post Note ? ?Patient: Natalie Davenport ? ?Procedure(s) Performed: AN AD HOC LABOR EPIDURAL ? ?  ? ?Patient location during evaluation: Mother Baby ?Anesthesia Type: Epidural ?Level of consciousness: awake and alert ?Pain management: pain level controlled ?Vital Signs Assessment: post-procedure vital signs reviewed and stable ?Respiratory status: spontaneous breathing, nonlabored ventilation and respiratory function stable ?Cardiovascular status: stable ?Postop Assessment: no headache, no backache, epidural receding, no apparent nausea or vomiting, patient able to bend at knees, adequate PO intake and able to ambulate ?Anesthetic complications: no ? ? ?No notable events documented. ? ?Last Vitals:  ?Vitals:  ? 08/11/21 0354 08/11/21 0830  ?BP: 114/72 125/84  ?Pulse: 77 74  ?Resp: 16 16  ?Temp: 36.7 ?C 36.6 ?C  ?SpO2: 99% 99%  ?  ?Last Pain:  ?Vitals:  ? 08/11/21 0830  ?TempSrc: Oral  ?PainSc: 0-No pain  ? ?Pain Goal: Patients Stated Pain Goal: 0 (08/10/21 1215) ? ?  ?  ?  ?  ?  ?  ?Epidural/Spinal Function Cutaneous sensation: Normal sensation (08/11/21 0830), Patient able to flex knees: Yes (08/11/21 0830), Patient able to lift hips off bed: Yes (08/11/21 0830), Back pain beyond tenderness at insertion site: No (08/11/21 0830), Progressively worsening motor and/or sensory loss: No (08/11/21 0830), Bowel and/or bladder incontinence post epidural: No (08/11/21 0830) ? ?[name] ? ? ? ? ?

## 2021-08-11 NOTE — Discharge Summary (Signed)
? ?  Postpartum Discharge Summary ? ? ? ?   ?Patient Name: Natalie Davenport ?DOB: 04/23/1996 ?MRN: [id number] ? ?Date of admission: 08/10/2021 ?Delivery date:08/10/2021  ?Delivering provider: Huel Cote  ?Date of discharge: 08/11/2021 ? ?Admitting diagnosis: Indication for care in labor and delivery, antepartum [O75.9] ?NSVD (normal spontaneous vaginal delivery) [O80] ?Intrauterine pregnancy: [redacted]w[redacted]d     ?Secondary diagnosis:  Principal Problem: ?  Indication for care in labor and delivery, antepartum ?Active Problems: ?  NSVD (normal spontaneous vaginal delivery) ? ?Additional problems: none    ?Discharge diagnosis: Term Pregnancy Delivered                                              ?Post partum procedures: none ?Augmentation: AROM, Pitocin, and Cytotec ?Complications: None ? ?Hospital course: Induction of Labor With Vaginal Delivery   ?26 y.o. yo G2P2002 at [redacted]w[redacted]d was admitted to the hospital 08/10/2021 for induction of labor.  Indication for induction: Elective.  Patient had an uncomplicated labor course as follows: ?Membrane Rupture Time/Date: 12:00 PM ,08/10/2021   ?Delivery Method:Vaginal, Spontaneous  ?Episiotomy: None  ?Lacerations:  1st degree;Perineal  ?Details of delivery can be found in separate delivery note.  Patient had a routine postpartum course. Patient is discharged home 08/11/21. ? ?Newborn Data: ?Birth date:08/10/2021  ?Birth time:3:57 PM  ?Gender:Female  ?Living status:Living  ?Apgars:8 ,9  ?Weight:3370 g  ? ? ? ?Physical exam  ?Vitals:  ? 08/10/21 1941 08/11/21 0000 08/11/21 0354 08/11/21 0830  ?BP: 125/76 116/79 114/72 125/84  ?Pulse: 74 75 77 74  ?Resp: 16 17 16 16   ?Temp: 98.4 ?F (36.9 ?C) 98.1 ?F (36.7 ?C) 98 ?F (36.7 ?C) 97.9 ?F (36.6 ?C)  ?TempSrc: Oral Oral Oral Oral  ?SpO2: 98% 99% 99% 99%  ?Weight:      ?Height:      ? ?General: alert and cooperative ?Lochia: appropriate ?Uterine Fundus: firm ? ?Labs: ?Lab Results  ?Component Value Date  ? WBC 16.6 (H) 08/11/2021  ? HGB 12.2  08/11/2021  ? HCT 36.4 08/11/2021  ? MCV 86.7 08/11/2021  ? PLT 289 08/11/2021  ? ? ?  Latest Ref Rng & Units 07/29/2019  ?  2:03 PM  ?CMP  ?Glucose 70 - 99 mg/dL 09/28/2019    ?BUN 6 - 20 mg/dL 7    ?Creatinine 0.44 - 1.00 mg/dL 431    ?Sodium 135 - 145 mmol/L 136    ?Potassium 3.5 - 5.1 mmol/L 4.5    ?Chloride 98 - 111 mmol/L 105    ?CO2 22 - 32 mmol/L 20    ?Calcium 8.9 - 10.3 mg/dL 9.0    ?Total Protein 6.5 - 8.1 g/dL 5.8    ?Total Bilirubin 0.3 - 1.2 mg/dL 0.8    ?Alkaline Phos 38 - 126 U/L 171    ?AST 15 - 41 U/L 22    ?ALT 0 - 44 U/L 25    ? ?Edinburgh Score: ? ?  08/10/2021  ?  6:15 PM  ?Edinburgh Postnatal Depression Scale Screening Tool  ?I have been able to laugh and see the funny side of things. 0  ?I have looked forward with enjoyment to things. 0  ?I have blamed myself unnecessarily when things went wrong. 0  ?I have been anxious or worried for no good reason. 1  ?I have felt scared or panicky for no good reason. 0  ?  Things have been getting on top of me. 0  ?I have been so unhappy that I have had difficulty sleeping. 0  ?I have felt sad or miserable. 0  ?I have been so unhappy that I have been crying. 0  ?The thought of harming myself has occurred to me. 0  ?Edinburgh Postnatal Depression Scale Total 1  ? ? ? ?After visit meds:  ?Allergies as of 08/11/2021   ? ?   Reactions  ? Septra [sulfamethoxazole-trimethoprim] Rash  ? ?  ? ?  ?Medication List  ?  ? ?TAKE these medications   ? ?acetaminophen 325 MG tablet ?Commonly known as: Tylenol ?Take 2 tablets (650 mg total) by mouth every 4 (four) hours as needed (for pain scale < 4). ?  ?ibuprofen 600 MG tablet ?Commonly known as: ADVIL ?Take 1 tablet (600 mg total) by mouth every 6 (six) hours. ?  ?prenatal multivitamin Tabs tablet ?Take 1 tablet by mouth at bedtime. ?  ?sertraline 50 MG tablet ?Commonly known as: ZOLOFT ?Take 50 mg by mouth daily. ?  ? ?  ? ? ? ?Discharge home in stable condition ?Infant Feeding: Breast ?Infant Disposition:home with  mother ?Discharge instruction: per After Visit Summary and Postpartum booklet. ?Activity: Advance as tolerated. Pelvic rest for 6 weeks.  ?Diet: routine diet ?Future Appointments:No future appointments. ?Follow up Visit: ? Follow-up Information   ? ? Huel Cote, MD. Schedule an appointment as soon as possible for a visit in 5 week(s).   ?Specialty: Obstetrics and Gynecology ?Why: routine postpartum ?Contact information: ?[address] ?[address] ?[place] [address] ?[phone] ? ? ?  ?  ? ?  ?  ? ?  ? ? ? ?Please schedule this patient for a In person postpartum visit in 6 weeks with the following provider: MD. ? ?Delivery mode:  Vaginal, Spontaneous  ?Anticipated Birth Control:  Unsure ? ? ?08/11/2021 ?Oliver Pila, MD ? ? ? ?

## 2021-08-14 ENCOUNTER — Inpatient Hospital Stay (HOSPITAL_COMMUNITY): Payer: BC Managed Care – PPO

## 2021-08-21 ENCOUNTER — Telehealth (HOSPITAL_COMMUNITY): Payer: Self-pay | Admitting: *Deleted

## 2021-08-21 NOTE — Telephone Encounter (Signed)
Attempted Hospital Discharge Follow-Up Call.  Left voice mail requesting that patient return RN's phone call.  

## 2023-04-06 ENCOUNTER — Encounter: Payer: Commercial Managed Care - PPO | Admitting: Obstetrics and Gynecology

## 2023-04-24 ENCOUNTER — Ambulatory Visit
Admission: RE | Admit: 2023-04-24 | Discharge: 2023-04-24 | Disposition: A | Discharge status: Home / Self Care | Payer: Commercial Managed Care - PPO | Source: Ambulatory Visit | Attending: Family Medicine | Admitting: Family Medicine

## 2023-04-24 ENCOUNTER — Other Ambulatory Visit: Payer: Self-pay

## 2023-04-24 VITALS — BP 122/76 | HR 78 | Temp 98.0°F | Resp 16

## 2023-04-24 DIAGNOSIS — S0502XA Injury of conjunctiva and corneal abrasion without foreign body, left eye, initial encounter: Secondary | ICD-10-CM

## 2023-04-24 MED ORDER — ERYTHROMYCIN 5 MG/GM OP OINT: TOPICAL_OINTMENT | OPHTHALMIC | 0 refills | Status: AC

## 2023-04-24 NOTE — ED Triage Notes (Signed)
Pt presents to [facility] with co of left eye pain and feeling like something is stuck in it following an injury this morning when her daughter accidentally hit her in the eye with her marker.

## 2023-04-24 NOTE — ED Provider Notes (Signed)
[facility]    CSN: [id number] Arrival date & time: 04/24/23  1719      History   Chief Complaint Chief Complaint  Patient presents with   Eye Problem    I think I scratched my eye - Entered by patient    HPI Natalie Davenport is a 27 y.o. female.  Patient presents today for evaluation of left eye injury she sustained when her daughter accidentally hit her in the left eye earlier this morning with a marker.  She reports that over the course of the day, eye pain has worsened and she has a sensation that something is stuck in her eye.  Denies any visual acuity changes.  Endorses clear drainage from the left eye throughout the day.  Past Medical History:  Diagnosis Date   Anxiety    Encounter for supervision of normal first pregnancy in third trimester 07/28/2019   SVD (spontaneous vaginal delivery) 07/29/2019    Patient Active Problem List   Diagnosis Date Noted   Indication for care in labor and delivery, antepartum 08/10/2021   NSVD (normal spontaneous vaginal delivery) 08/10/2021   Normal pregnancy in third trimester 07/29/2019   SVD (spontaneous vaginal delivery) 07/29/2019   Encounter for supervision of normal first pregnancy in third trimester 07/28/2019    Past Surgical History:  Procedure Laterality Date   WISDOM TOOTH EXTRACTION      OB History     Gravida  2   Para  2   Term  2   Preterm      AB      Living  2      SAB      IAB      Ectopic      Multiple  0   Live Births  2            Home Medications    Prior to Admission medications   Medication Sig Start Date End Date Taking? Authorizing Provider  erythromycin ophthalmic ointment Place a 1/2 inch ribbon of ointment into the lower eyelid twice daily for 5 days 04/24/23 05/01/23 Yes Bing Neighbors, NP  acetaminophen (TYLENOL) 325 MG tablet Take 2 tablets (650 mg total) by mouth every 4 (four) hours as needed (for pain scale < 4). 08/11/21   Huel Cote, MD   ibuprofen (ADVIL) 600 MG tablet Take 1 tablet (600 mg total) by mouth every 6 (six) hours. 08/11/21   Huel Cote, MD  Prenatal Vit-Fe Fumarate-FA (PRENATAL MULTIVITAMIN) TABS tablet Take 1 tablet by mouth at bedtime. 07/31/19   Bovard-Stuckert, Augusto Gamble, MD  sertraline (ZOLOFT) 50 MG tablet Take 50 mg by mouth daily. 02/22/21   [provider]    Family History Family History  Problem Relation Age of Onset   Hyperlipidemia Maternal Aunt    Alcohol abuse Maternal Aunt    Hyperlipidemia Maternal Grandmother    Hyperlipidemia Maternal Grandfather    Breast cancer Paternal Grandmother    Cancer Paternal Grandmother    Heart disease Paternal Grandmother    Healthy Mother    Anxiety disorder Mother    Healthy Father    Anxiety disorder Sister     Social History Social History   Tobacco Use   Smoking status: Never   Smokeless tobacco: Never  Vaping Use   Vaping status: Never Used  Substance Use Topics   Alcohol use: Not Currently    Comment: Social drinking only on occasion   Drug use: Never  Allergies   Septra [sulfamethoxazole-trimethoprim]  Review of Systems Review of Systems Pertinent negatives listed in HPI   Physical Exam Triage Vital Signs ED Triage Vitals [04/24/23 1738]  Encounter Vitals Group     BP      Systolic BP Percentile      Diastolic BP Percentile      Pulse      Resp      Temp      Temp src      SpO2      Weight      Height      Head Circumference      Peak Flow      Pain Score 4     Pain Loc      Pain Education      Exclude from Growth Chart     Physical Exam Vitals reviewed.  Constitutional:      Appearance: Normal appearance.  HENT:     Head: Normocephalic and atraumatic.  Eyes:     General: Lids are normal. Vision grossly intact. No allergic shiner or visual field deficit.       Left eye: Discharge present.    Extraocular Movements: Extraocular movements intact.     Conjunctiva/sclera:     Left eye: Left  conjunctiva is not injected. No chemosis, exudate or hemorrhage.    Pupils: Pupils are equal, round, and reactive to light.  Cardiovascular:     Rate and Rhythm: Normal rate and regular rhythm.  Pulmonary:     Effort: Pulmonary effort is normal.     Breath sounds: Normal breath sounds and air entry.  Musculoskeletal:        General: Normal range of motion.  Neurological:     General: No focal deficit present.     Mental Status: She is alert and oriented to person, place, and time.      No data found.  Updated Vital Signs BP 122/76   Pulse 78   Temp 98 F (36.7 C)   Resp 16   LMP  (LMP Unknown) Comment: nexplanon  SpO2 98%   Breastfeeding Yes   Visual Acuity Right Eye Distance:   Left Eye Distance:   Bilateral Distance:    Right Eye Near:   Left Eye Near:    Bilateral Near:        UC Treatments / Results  Labs (all labs ordered are listed, but only abnormal results are displayed) Labs Reviewed - No data to display  EKG   Radiology No results found.  Procedures Procedures (including critical care time)  Medications Ordered in UC Medications - No data to display  Initial Impression / Assessment and Plan / UC Course  I have reviewed the triage vital signs and the nursing notes.  Pertinent labs & imaging results that were available during my care of the patient were reviewed by me and considered in my medical decision making (see chart for details).    Corneal abrasion involving the left eye, treatment with Romycin 1 application to lower eyelid twice daily for 5 days.  Return precautions given if symptoms worsen or do not improve with treatment.  Patient verbalized understanding and agreement with plan. Final Clinical Impressions(s) / UC Diagnoses   Final diagnoses:  Abrasion of left cornea, initial encounter     Discharge Instructions      If you develop any worsening of symptoms return for evaluation.  Complete treatment as prescribed with Romycin  1 application twice daily for 5 days.  May use the tetracaine up 2 times as needed for acute eye pain, for next 24 hours then discontinue.     ED Prescriptions     Medication Sig Dispense Auth. Provider   erythromycin ophthalmic ointment Place a 1/2 inch ribbon of ointment into the lower eyelid twice daily for 5 days 3.5 g Bing Neighbors, NP      PDMP not reviewed this encounter.   Bing Neighbors, NP 04/24/23 1806

## 2023-04-24 NOTE — Discharge Instructions (Addendum)
If you develop any worsening of symptoms return for evaluation.  Complete treatment as prescribed with Romycin 1 application twice daily for 5 days.  May use the tetracaine up 2 times as needed for acute eye pain, for next 24 hours then discontinue.

## 2023-05-04 ENCOUNTER — Encounter: Payer: Self-pay | Admitting: Obstetrics & Gynecology

## 2023-05-04 ENCOUNTER — Ambulatory Visit (INDEPENDENT_AMBULATORY_CARE_PROVIDER_SITE_OTHER): Payer: Commercial Managed Care - PPO | Admitting: Obstetrics & Gynecology

## 2023-05-04 VITALS — BP 113/79 | HR 79 | Ht 67.0 in | Wt 248.0 lb

## 2023-05-04 DIAGNOSIS — Z3046 Encounter for surveillance of implantable subdermal contraceptive: Secondary | ICD-10-CM

## 2023-05-04 NOTE — Progress Notes (Signed)
   Subjective:    Patient ID: Natalie Davenport, female    DOB: 06/17/1995, 27 y.o.   MRN: [id number]  HPI  27 yo female presents for Nexplanon removal  Considering trying to conceive soon.  Nexplanon is difficult for her to feel and can sometimes feel when she is driving. She was unable to feel the Nexplanon today.    Review of Systems  Constitutional: Negative.   Respiratory: Negative.    Cardiovascular: Negative.   Gastrointestinal: Negative.   Genitourinary: Negative.       Objective:   Physical Exam Vitals reviewed.  Constitutional:      General: She is not in acute distress.    Appearance: She is well-developed.  HENT:     Head: Normocephalic and atraumatic.  Eyes:     Conjunctiva/sclera: Conjunctivae normal.  Cardiovascular:     Rate and Rhythm: Normal rate.  Pulmonary:     Effort: Pulmonary effort is normal.  Musculoskeletal:       Arms:  Skin:    General: Skin is warm and dry.  Neurological:     Mental Status: She is alert and oriented to person, place, and time.  Psychiatric:        Mood and Affect: Mood normal.    Vitals:   05/04/23 1542  BP: 113/79  Pulse: 79  Weight: 248 lb (112.5 kg)  Height: 5\' 7"  (1.702 m)    Natalie Davenport is a 27 y.o. [id number] here for Nexplanon removal. No GYN concerns.   Nexplanon Removal Patient was given informed consent for removal of her Nexplanon.  Appropriate time out taken. Nexplanon site identified and informed patient that the Nexplanon was very deep.  She was unable to feel the Nexplanon herself today.  Prior to injection of lidocaine I could barely feel the distal tip..  Area prepped in usual sterile fashon. One ml of 1% lidocaine was used to anesthetize the area at the distal end of the implant. A small stab incision was made right beside the implant on the distal portion.  At this oint the Nexplanon tip could no longer be felt.  The proximal end is so deep that it was unable to be pushed into incision.  After  several minutes of working to find tip with palpation and hemostat, procedure was stopped. There was minimal blood loss. There were no complications.  A small amount of antibiotic ointment and steri-strips were applied over the small incision.  A pressure bandage was applied to reduce any bruising.  The patient tolerated the procedure well and was given post procedure instructions.  Dr. Velva Harman will remove under US guidance.          Assessment & Plan:  27 year old female with displaced Nexplanon  Nexplanon attempt removal today as above Dr. Benjamin Stain will remove the Nexplanon under ultrasound guidance later this month Patient is already on prenatal vitamins.

## 2023-05-14 ENCOUNTER — Ambulatory Visit: Payer: Commercial Managed Care - PPO | Admitting: Sports Medicine

## 2023-05-14 DIAGNOSIS — Z3046 Encounter for surveillance of implantable subdermal contraceptive: Secondary | ICD-10-CM | POA: Diagnosis not present

## 2023-05-14 NOTE — Progress Notes (Signed)
    Procedures performed today:    Procedure:  Removal of left arm Nexplanon Risks, benefits, alternatives explained to patient. Consent obtained. Time out conducted. I used the ultrasound to verify the location as well as the proximal and distal edges of the Nexplanon. A small amount of lidocaine infiltrated with epinephrine under and around the neck for local anesthesia. Using a #15 blade I made a stab incision over the distal aspect of the Nexplanon. Using blunt dissection I was able to visualize the device, the Nexplanon was successfully removed. A single 3-0 simple interrupted Ethilon suture was placed to close the wound. Advised to return if increased redness, swelling, drainage, fevers, or chills.  Independent interpretation of notes and tests performed by another provider:   None.  Brief History, Exam, Impression, and Recommendations:    Encounter for Nexplanon removal This is a pleasant 27 year old female, she is here as a referral from OB/GYN for Nexplanon removal. It sounds like removal was attempted but the Nexplanon was unable to be located. Today we made the decision to remove the Nexplanon in the office. I was able to localize it with ultrasound guidance, performed successful removal. 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 Return as needed.    ____________________________________________ Ihor Austin. Benjamin Stain, M.D., ABFM., CAQSM., AME. Primary Care and Sports Medicine  [facility]  Adjunct Professor of Family Medicine  [employer]  Restaurant manager, fast food

## 2023-05-14 NOTE — Assessment & Plan Note (Addendum)
This is a pleasant 27 year old female, she is here as a referral from OB/GYN for Nexplanon removal. It sounds like removal was attempted but the Nexplanon was unable to be located. Today we made the decision to remove the Nexplanon in the office. I was able to localize it with ultrasound guidance, performed successful removal. 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 Return as needed.

## 2023-05-19 ENCOUNTER — Ambulatory Visit: Payer: Commercial Managed Care - PPO | Admitting: Medical-Surgical

## 2023-05-27 IMAGING — DX DG FOOT COMPLETE 3+V*R*
3 series · 3 of 3 positions shown · non-contrast
Comparison: None.

CLINICAL DATA: Injury, lateral foot pain.

EXAM:
RIGHT FOOT COMPLETE - 3+ VIEW

[foot ap]
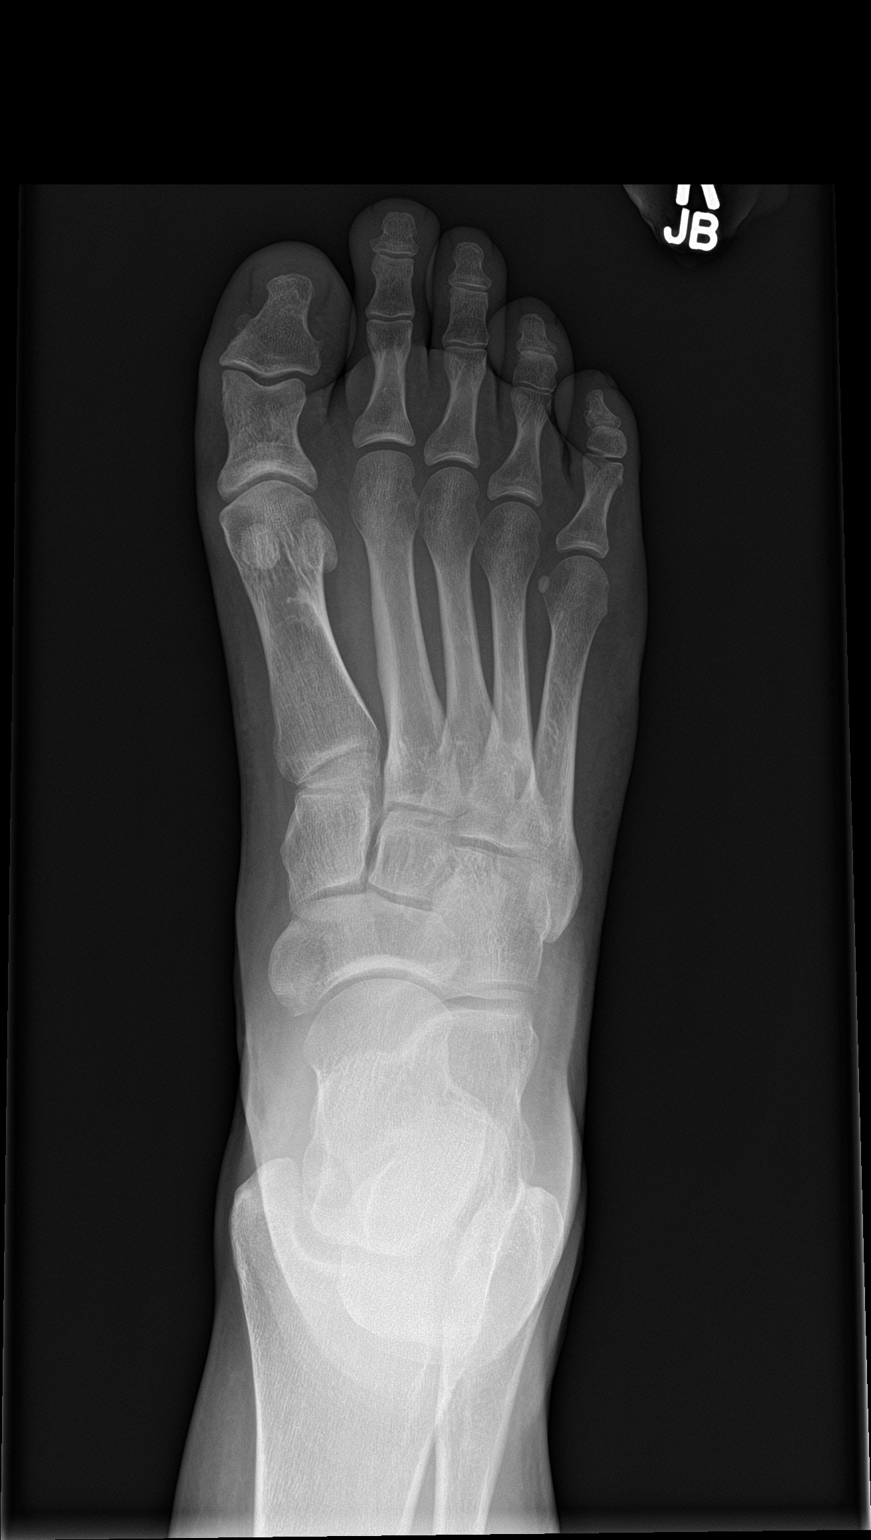

[foot obl]
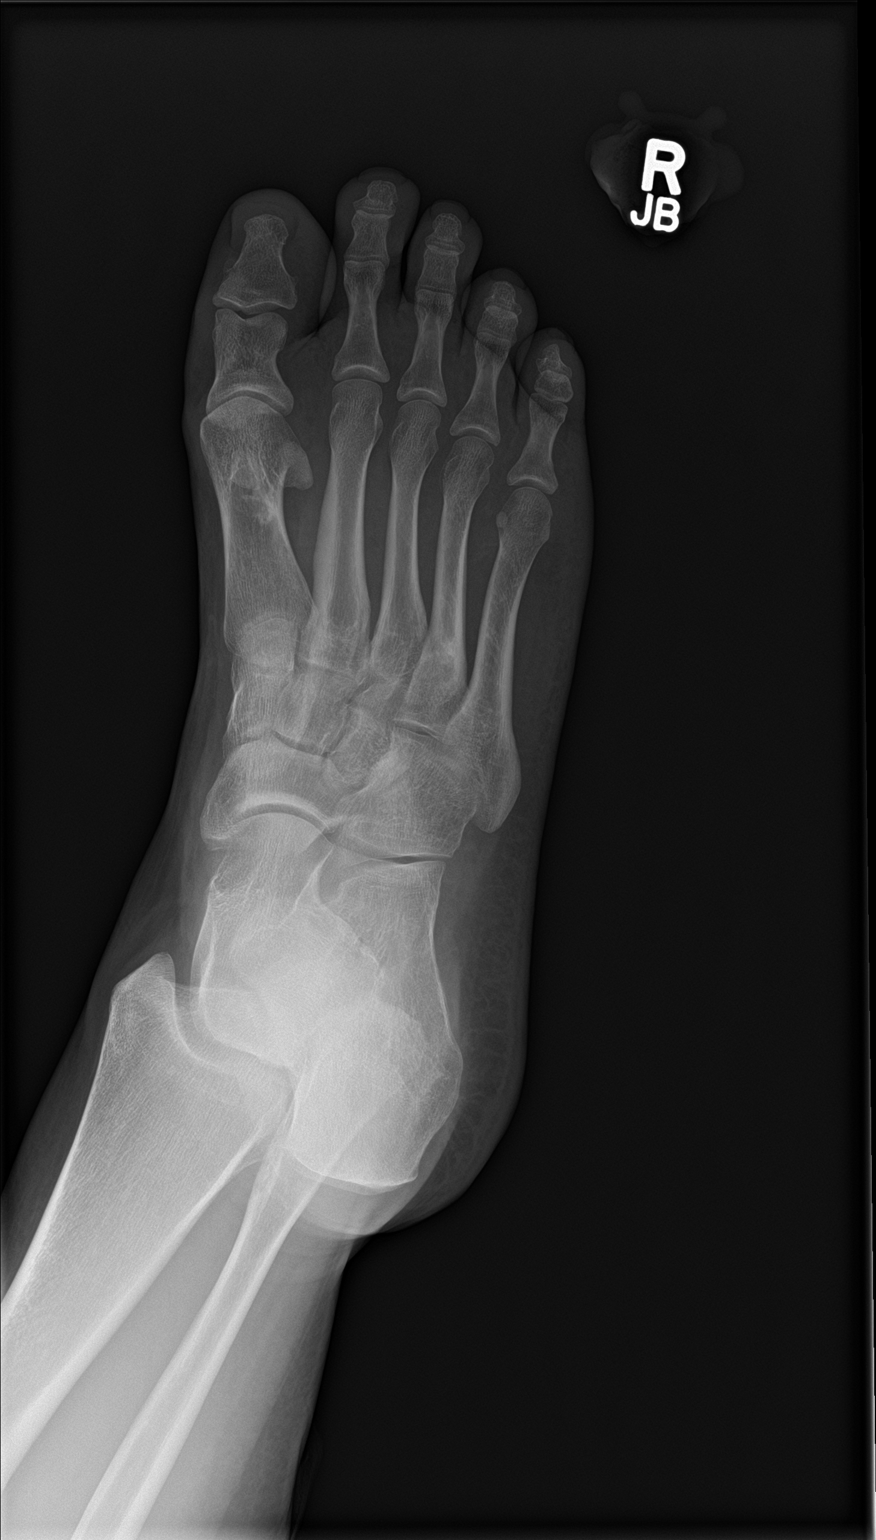

[foot lat]
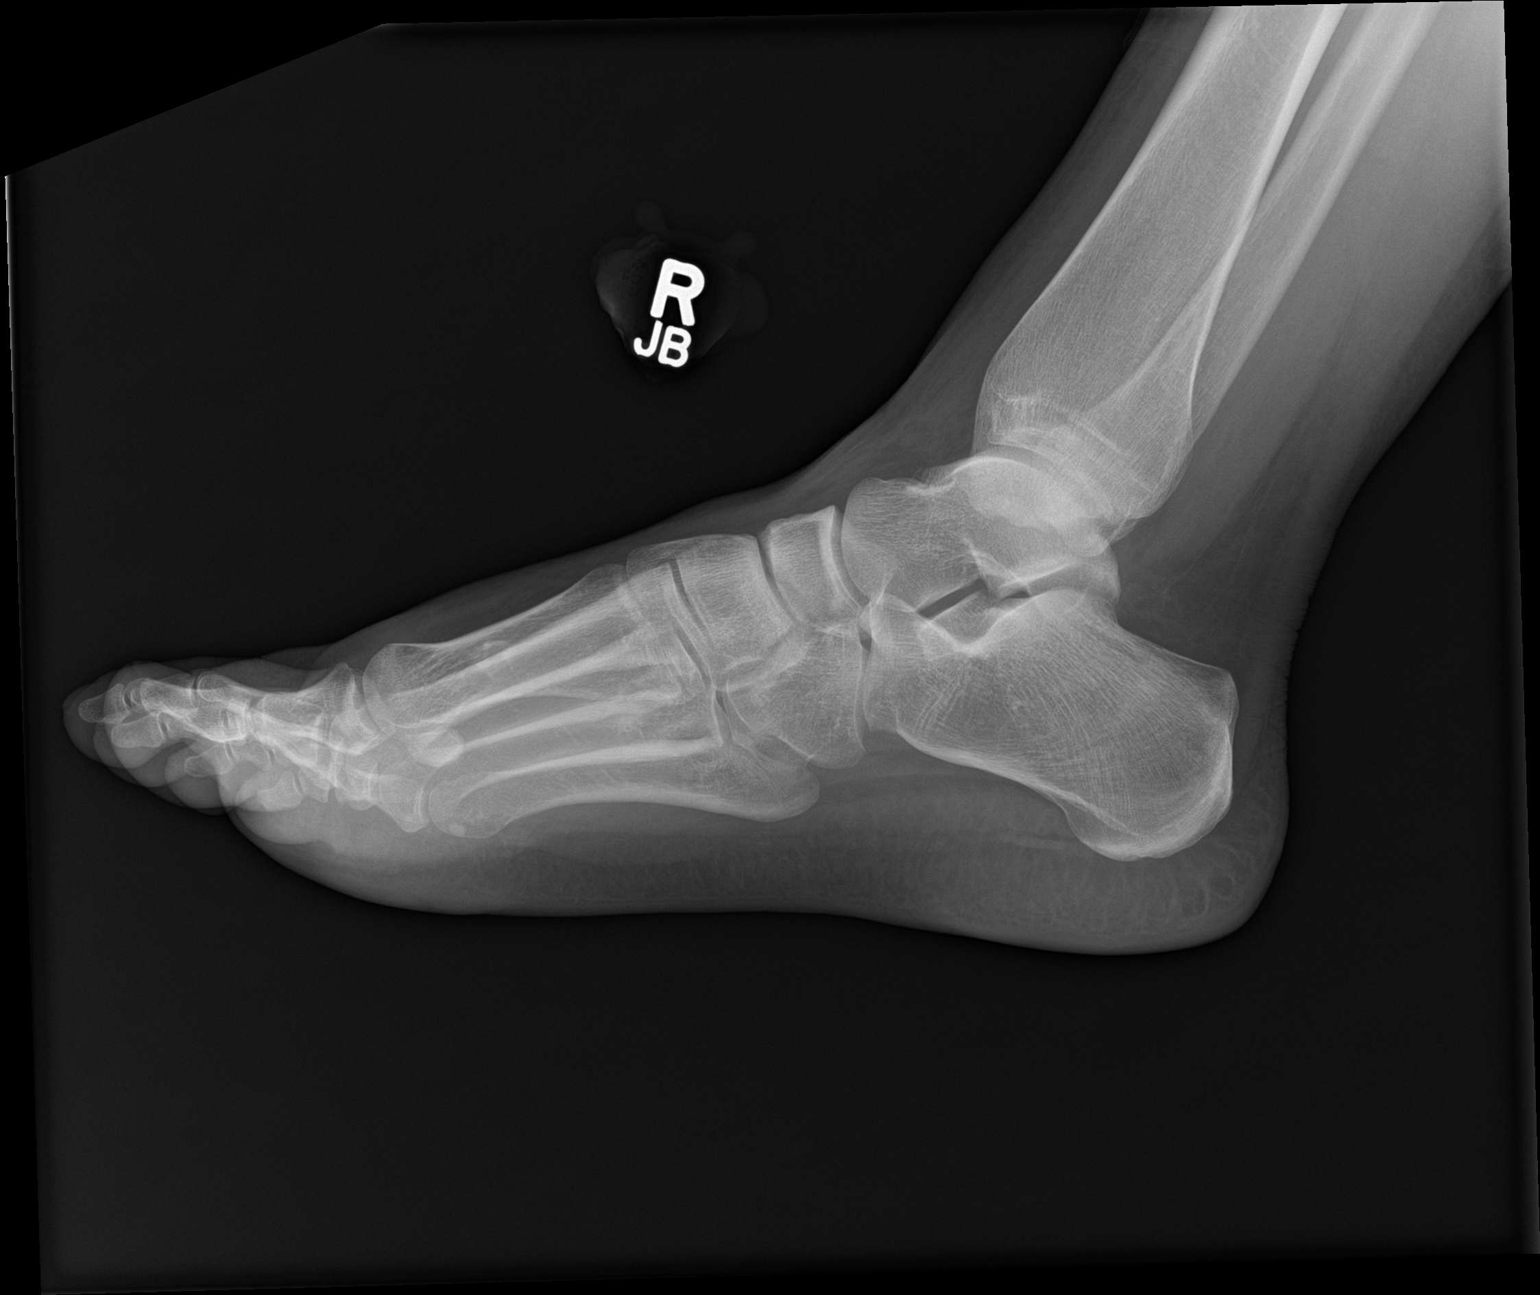

[3 of 3 positions shown; findings below may reference images not displayed]

FINDINGS: Osseous alignment is normal. No definite fracture line or displaced
fracture fragment is seen.

There is, however, faint lucency projected over the medial aspects
of the navicular bone. This is most likely artifactual but can not
be excluded as a nondisplaced fracture.

Soft tissues about the RIGHT foot are unremarkable.
IMPRESSION: 1. Faint lucency projected over the medial aspects of the navicular
bone. This is most likely artifactual but can not be excluded as a
nondisplaced fracture line. Any pain over the medial navicular
region? If so, recommend follow-up plain film examination in 10-14
days to evaluate for healing fracture line.
2. Remainder of the osseous structures of the RIGHT foot appear
intact and normally aligned.

## 2024-01-19 ENCOUNTER — Encounter: Payer: Self-pay | Admitting: Sports Medicine
